# Patient Record
Sex: Male | Born: 1945 | Race: White | Hispanic: No | Marital: Married | Smoking: Never smoker
Health system: Southern US, Community
[De-identification: ages and names within clinical notes are randomized; demographics above are authoritative.]

## PROBLEM LIST (undated history)

## (undated) DIAGNOSIS — M1990 Unspecified osteoarthritis, unspecified site: Secondary | ICD-10-CM

## (undated) HISTORY — PX: OTHER SURGICAL HISTORY: SHX169

## (undated) HISTORY — PX: EYE SURGERY: SHX253

## (undated) HISTORY — PX: COLONOSCOPY: SHX174

---

## 2019-03-08 ENCOUNTER — Ambulatory Visit: Payer: Medicare Other | Admitting: Family Medicine

## 2019-03-08 ENCOUNTER — Encounter: Payer: Self-pay | Admitting: Family Medicine

## 2019-03-08 ENCOUNTER — Other Ambulatory Visit: Payer: Self-pay

## 2019-03-08 VITALS — BP 120/64 | HR 63 | Temp 98.4°F | Ht 68.0 in | Wt 175.5 lb

## 2019-03-08 DIAGNOSIS — M7502 Adhesive capsulitis of left shoulder: Secondary | ICD-10-CM | POA: Diagnosis not present

## 2019-03-08 DIAGNOSIS — M25512 Pain in left shoulder: Secondary | ICD-10-CM | POA: Diagnosis not present

## 2019-03-08 MED ORDER — METHYLPREDNISOLONE ACETATE 40 MG/ML IJ SUSP
40.0000 mg | Freq: Once | INTRAMUSCULAR | Status: AC
  Administered 2019-03-08: 12:00:00 40 mg via INTRAMUSCULAR

## 2019-03-08 NOTE — Progress Notes (Signed)
Cory Peloso T. Bryar Rennie, MD Primary Care and [place] at [facility] [place], [address] Phone: [phone]  FAX: [phone]  Cory Collins - 73 y.o. male  MRN [id number]  Date of Birth: 1946-06-04  Visit Date: 03/08/2019  PCP: Carley Hammed, MD  Referred by: No ref. provider found  Chief Complaint  Patient presents with  . Shoulder Pain    Left-hurt 6 months ago lifting weights   Subjective:   Cory Collins is a 73 y.o. very pleasant male patient who presents with the following: shoulder pain  The patient noted above presents with shoulder pain that has been ongoing for 6 mo. there is ? History of injury at gym and also using a pole saw The patient denies neck pain or radicular symptoms. No shoulder blade pain Denies dislocation, subluxation, separation of the shoulder. The patient does complain of pain with flexion, abduction, and terminal motion.  Significant restriction of motion. he describes a deep ache around the shoulder, and sometimes it will wake the patient up at night.  No old injrues  L adh caps  Medications Tried: tylenol and nsaids Ice or Heat: minimal help Tried PT: No  Prior shoulder Injury: No Prior surgery: No Prior fracture: No  Past Medical History, Surgical History, Social History, Family History, Medications, and allergies reviewed and updated if relevant.   There are no active problems to display for this patient.   History reviewed. No pertinent past medical history.  History reviewed. No pertinent surgical history.  Social History   Socioeconomic History  . Marital status: Married    Spouse name: Not on file  . Number of children: Not on file  . Years of education: Not on file  . Highest education level: Not on file  Occupational History  . Not on file  Social Needs  . Financial resource strain: Not on file  . Food insecurity    Worry: Not on file    Inability: Not on  file  . Transportation needs    Medical: Not on file    Non-medical: Not on file  Tobacco Use  . Smoking status: Never Smoker  . Smokeless tobacco: Never Used  Substance and Sexual Activity  . Alcohol use: Not on file  . Drug use: Not on file  . Sexual activity: Not on file  Lifestyle  . Physical activity    Days per week: Not on file    Minutes per session: Not on file  . Stress: Not on file  Relationships  . Social Herbalist on phone: Not on file    Gets together: Not on file    Attends religious service: Not on file    Active member of club or organization: Not on file    Attends meetings of clubs or organizations: Not on file    Relationship status: Not on file  . Intimate partner violence    Fear of current or ex partner: Not on file    Emotionally abused: Not on file    Physically abused: Not on file    Forced sexual activity: Not on file  Other Topics Concern  . Not on file  Social History Narrative  . Not on file    Family History  Problem Relation Age of Onset  . Cancer Sister     Allergies  Allergen Reactions  . Codeine Nausea And Vomiting    Med[facility] and [facility].  GEN: No fevers, chills. Nontoxic. Primarily MSK c/o today. MSK: Detailed in the HPI GI: tolerating PO intake without difficulty Neuro: No numbness, parasthesias, or tingling associated. Otherwise the pertinent positives of the ROS are noted above.    Objective:   Blood pressure 120/64, pulse 63, temperature 98.4 F (36.9 C), temperature source Temporal, height 5\' 8"  (1.727 m), weight 175 lb 8 oz (79.6 kg), SpO2 97 %.   GEN: WDWN, NAD, Non-toxic, Alert & Oriented x 3 HEENT: Atraumatic, Normocephalic.  Ears and Nose: No external deformity. EXTR: No clubbing/cyanosis/edema NEURO: Normal gait.  PSYCH: Normally interactive. Conversant. Not depressed or anxious appearing.  Calm demeanor.   Shoulder: R and L Inspection: No muscle  wasting or winging Ecchymosis/edema: neg  AC joint, scapula, clavicle: NT Cervical spine: NT, full ROM Spurling's: neg ABNORMAL SIDE TESTED: L UNLESS OTHERWISE NOTED, THE CONTRALATERAL SIDE HAS FULL RANGE OF MOTION. Abduction: 5/5, LIMITED TO 140 DEGREES Flexion: 5/5, LIMITED TO 130 DEGNO ROM  IR, lift-off: 5/5. TESTED AT 90 DEGREES OF ABDUCTION, LIMITED TO 0 DEGREES ER at neutral:  5/5, TESTED AT 90 DEGREES OF ABDUCTION, LIMITED TO 25 DEGREES AC crossover and compression: PAIN Drop Test: neg Empty Can: neg Supraspinatus insertion: NT Bicipital groove: NT ALL OTHER SPECIAL TESTING EQUIVOCAL GIVEN LOSS OF MOTION C5-T1 intact Sensation intact Grip 5/5   Assessment and Plan:     ICD-10-CM   1. Adhesive capsulitis of left shoulder  M75.02   2. Acute pain of left shoulder  M25.512 methylPREDNISolone acetate (DEPO-MEDROL) injection 40 mg    >25 minutes spent in face to face time with patient, >50% spent in counselling or coordination of care  Patient was given a systematic ROM protocol from [employer] to be done daily. Emphasized importance of adherence, daily HEP.  The average length of total symptoms is 12-18 months going through 3 different phases in the freezing and thawing process. Reviewed all with patient.   Tylenol or NSAID of choice prn for pain relief Intraarticular shoulder injections discussed with patient, which have good evidence for accelerating the thawing phase.  Patient will be sent for formal PT for aggressive frozen shoulder ROM when in thawing phase Will need RTC str and scapular stabilization to fix underlying mechanics.  Intraarticular Shoulder Aspiration/Injection Procedure Note Kari BaarsLanny Rettinger 05/26/1946 Date of procedure: 03/08/2019  Procedure: Large Joint Aspiration / Injection of Shoulder, Intraarticular, L Indications: Pain  Procedure Details Verbal consent was obtained from the patient. Risks including infection explained and contrasted with benefits and  alternatives. Patient prepped with Chloraprep and Ethyl Chloride used for anesthesia. An intraarticular shoulder injection was performed using the posterior approach; needle placed into joint capsule without difficulty. The patient tolerated the procedure well and had decreased pain post injection. No complications. Injection: 8 cc of Lidocaine 1% and 2 mL Depo-Medrol 40 mg. Needle: 21 gauge, 2 inch   Follow-up: Return in about 2 months (around 05/08/2019).  Meds ordered this encounter  Medications  . methylPREDNISolone acetate (DEPO-MEDROL) injection 40 mg   No orders of the defined types were placed in this encounter.   Signed,  Elpidio GaleaSpencer T. Monay Houlton, MD   Patient's Medications   No medications on file

## 2019-03-28 ENCOUNTER — Telehealth: Payer: Self-pay | Admitting: Family Medicine

## 2019-03-28 DIAGNOSIS — M7502 Adhesive capsulitis of left shoulder: Secondary | ICD-10-CM

## 2019-03-28 NOTE — Telephone Encounter (Signed)
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  Dr.Copland told patient to call him back, if his shoulder was doing better and Dr.Copland would refer patient to physical therapy.  Patient said his shoulder is doing better.  Patient wants to go to [place] in [place].  Patient can go anytime.

## 2019-03-29 NOTE — Telephone Encounter (Signed)
done

## 2019-05-07 NOTE — Progress Notes (Signed)
Cory Collins T. [name], MD Primary Care and Sports Medicine [facility] at [facility] [address] [place], [address] Phone: [phone]  FAX: [phone]  Cory Collins - 73 y.o. male  MRN [id number]  Date of Birth: [birth year]  Visit Date: 05/08/2019  PCP: Ventura Sellers, MD  Referred by: Ventura Sellers, MD  Chief Complaint  Patient presents with  . Follow-up    Left Shoulder   Subjective:   Cory Collins is a 73 y.o. very pleasant male patient with Body mass index is 26.84 kg/m. who presents with the following:  8 mo of significant frozen shoulder who is here for 6 week follow-up.  At last OV I did an intraarticular shoulder injection with steroids and gave him some HEP from [facility].  Stretching 3 times a day. He really has not made any significant progress at all, his shoulder still hurts quite a bit.  This is approximately 10 months in time duration.   03/28/2019 Last OV with Hannah Beat, MD  The patient noted above presents with shoulder pain that has been ongoing for 6 mo. there is ? History of injury at gym and also using a pole saw The patient denies neck pain or radicular symptoms. No shoulder blade pain Denies dislocation, subluxation, separation of the shoulder. The patient does complain of pain with flexion, abduction, and terminal motion.  Significant restriction of motion. he describes a deep ache around the shoulder, and sometimes it will wake the patient up at night.  No old injrues  L adh caps  Medications Tried: tylenol and nsaids Ice or Heat: minimal help Tried PT: No  Prior shoulder Injury: No Prior surgery: No Prior fracture: No   Past Medical History, Surgical History, Social History, Family History, Problem List, Medications, and Allergies have been reviewed and updated if relevant.  There are no active problems to display for this patient.   History reviewed. No pertinent past medical history.  History  reviewed. No pertinent surgical history.  Social History   Socioeconomic History  . Marital status: Married    Spouse name: Not on file  . Number of children: Not on file  . Years of education: Not on file  . Highest education level: Not on file  Occupational History  . Not on file  Social Needs  . Financial resource strain: Not on file  . Food insecurity    Worry: Not on file    Inability: Not on file  . Transportation needs    Medical: Not on file    Non-medical: Not on file  Tobacco Use  . Smoking status: Never Smoker  . Smokeless tobacco: Never Used  Substance and Sexual Activity  . Alcohol use: Not on file  . Drug use: Not on file  . Sexual activity: Not on file  Lifestyle  . Physical activity    Days per week: Not on file    Minutes per session: Not on file  . Stress: Not on file  Relationships  . Social Musician on phone: Not on file    Gets together: Not on file    Attends religious service: Not on file    Active member of club or organization: Not on file    Attends meetings of clubs or organizations: Not on file    Relationship status: Not on file  . Intimate partner violence    Fear of current or ex partner: Not on file    Emotionally abused:  Not on file    Physically abused: Not on file    Forced sexual activity: Not on file  Other Topics Concern  . Not on file  Social History Narrative  . Not on file    Family History  Problem Relation Age of Onset  . Cancer Sister     Allergies  Allergen Reactions  . Codeine Nausea And Vomiting    Medication list reviewed and updated in full in [facility].  GEN: No fevers, chills. Nontoxic. Primarily MSK c/o today. MSK: Detailed in the HPI GI: tolerating PO intake without difficulty Neuro: No numbness, parasthesias, or tingling associated. Otherwise the pertinent positives of the ROS are noted above.   Objective:   BP 140/80   Pulse 64   Temp 98.2 F (36.8 C) (Temporal)   Ht 5'  8" (1.727 m)   Wt 176 lb 8 oz (80.1 kg)   SpO2 97%   BMI 26.84 kg/m    GEN: WDWN, NAD, Non-toxic, Alert & Oriented x 3 HEENT: Atraumatic, Normocephalic.  Ears and Nose: No external deformity. EXTR: No clubbing/cyanosis/edema NEURO: Normal gait.  PSYCH: Normally interactive. Conversant. Not depressed or anxious appearing.  Calm demeanor.   Shoulder: R and L Inspection: No muscle wasting or winging Ecchymosis/edema: neg  AC joint, scapula, clavicle: NT Cervical spine: NT, full ROM Spurling's: neg ABNORMAL SIDE TESTED: L UNLESS OTHERWISE NOTED, THE CONTRALATERAL SIDE HAS FULL RANGE OF MOTION. Abduction: 4/5, LIMITED TO 125 DEGREES Flexion: 5/5, LIMITED TO 125 DEGNO ROM  IR, lift-off: 5/5. TESTED AT 90 DEGREES OF ABDUCTION, LIMITED TO 0 DEGREES ER at neutral:  5/5, TESTED AT 90 DEGREES OF ABDUCTION, LIMITED TO 35 DEGREES AC crossover and compression: PAIN Drop Test: neg Empty Can: neg Supraspinatus insertion: NT Bicipital groove: NT ALL OTHER SPECIAL TESTING EQUIVOCAL GIVEN LOSS OF MOTION C5-T1 intact Sensation intact Grip 5/5    Radiology: No results found.   Assessment and Plan:     ICD-10-CM   1. Adhesive capsulitis of left shoulder  M75.02 DG Shoulder Left    MR Shoulder Left Wo Contrast  2. Acute pain of left shoulder  M25.512 DG Shoulder Left    MR Shoulder Left Wo Contrast   >25 minutes spent in face to face time with patient, >50% spent in counselling or coordination of care   Failure to improve on 10 months of conservative care.  He has been to formal physical therapy, extensive home rehab program, and I did an intra-articular shoulder injection on his last visit 2 months ago.  He continues to do poorly and have pain with abduction, internal range of motion, as well as external range of motion.  Obtain an MRI of the left shoulder to further evaluate to determine if the patient has a partial-thickness or full-thickness rotator cuff tear.  At this point he is  failed conservative management.  We talked fairly extensively about conservative versus more aggressive management of his shoulder condition, the patient chose to obtain an MRI to scout for potential surgery.  Follow-up: No follow-ups on file.  No orders of the defined types were placed in this encounter.  Orders Placed This Encounter  Procedures  . DG Shoulder Left  . MR Shoulder Left Wo Contrast    Signed,  Journey Ratterman T. Ishia Tenorio, MD   No outpatient encounter medications on file as of 05/08/2019.   No facility-administered encounter medications on file as of 05/08/2019.

## 2019-05-08 ENCOUNTER — Ambulatory Visit (INDEPENDENT_AMBULATORY_CARE_PROVIDER_SITE_OTHER): Payer: Medicare Other | Admitting: Family Medicine

## 2019-05-08 ENCOUNTER — Ambulatory Visit (INDEPENDENT_AMBULATORY_CARE_PROVIDER_SITE_OTHER)
Admission: RE | Admit: 2019-05-08 | Discharge: 2019-05-08 | Disposition: A | Discharge status: Home / Self Care | Payer: Medicare Other | Source: Ambulatory Visit | Attending: Family Medicine | Admitting: Family Medicine

## 2019-05-08 ENCOUNTER — Other Ambulatory Visit: Payer: Self-pay

## 2019-05-08 ENCOUNTER — Encounter: Payer: Self-pay | Admitting: Family Medicine

## 2019-05-08 VITALS — BP 140/80 | HR 64 | Temp 98.2°F | Ht 68.0 in | Wt 176.5 lb

## 2019-05-08 DIAGNOSIS — M25512 Pain in left shoulder: Secondary | ICD-10-CM

## 2019-05-08 DIAGNOSIS — M7502 Adhesive capsulitis of left shoulder: Secondary | ICD-10-CM

## 2019-05-17 ENCOUNTER — Ambulatory Visit
Admission: RE | Admit: 2019-05-17 | Discharge: 2019-05-17 | Disposition: A | Discharge status: Home / Self Care | Payer: Medicare Other | Source: Ambulatory Visit | Attending: Family Medicine | Admitting: Family Medicine

## 2019-05-17 ENCOUNTER — Other Ambulatory Visit: Payer: Self-pay

## 2019-05-17 DIAGNOSIS — M7502 Adhesive capsulitis of left shoulder: Secondary | ICD-10-CM | POA: Insufficient documentation

## 2019-05-17 DIAGNOSIS — M25512 Pain in left shoulder: Secondary | ICD-10-CM | POA: Diagnosis not present

## 2019-05-20 ENCOUNTER — Ambulatory Visit: Payer: Medicare Other

## 2019-07-19 ENCOUNTER — Ambulatory Visit: Payer: Medicare Other | Admitting: Family Medicine

## 2019-11-23 ENCOUNTER — Other Ambulatory Visit: Payer: Self-pay | Admitting: Orthopedic Surgery

## 2019-11-23 DIAGNOSIS — M25512 Pain in left shoulder: Secondary | ICD-10-CM

## 2019-11-29 ENCOUNTER — Telehealth: Payer: Self-pay | Admitting: Family Medicine

## 2019-11-29 ENCOUNTER — Other Ambulatory Visit: Payer: Self-pay

## 2019-11-29 ENCOUNTER — Ambulatory Visit
Admission: RE | Admit: 2019-11-29 | Discharge: 2019-11-29 | Disposition: A | Discharge status: Home / Self Care | Payer: Medicare PPO | Source: Ambulatory Visit | Attending: Orthopedic Surgery | Admitting: Orthopedic Surgery

## 2019-11-29 DIAGNOSIS — M25512 Pain in left shoulder: Secondary | ICD-10-CM

## 2019-11-29 NOTE — Telephone Encounter (Signed)
Surgical Clearance will need to be done by his PCP.

## 2019-11-29 NOTE — Telephone Encounter (Signed)
Please document on form and fax back to [facility].  Form on Donna's desk.

## 2019-11-29 NOTE — Telephone Encounter (Signed)
Surgical Clearance form faxed back to [facility] advising them that Cory Collins surgical clearance needs to be done by his PCP: Dr. Ventura Sellers.

## 2019-11-29 NOTE — Telephone Encounter (Signed)
Clearance for surgery form faxed from [facility]. Placed on cart.

## 2019-12-14 ENCOUNTER — Other Ambulatory Visit: Payer: Medicare Other

## 2020-01-03 NOTE — Progress Notes (Signed)
PCP - Dr. Ventura Sellers Cardiologist -   PPM/ICD -  Device Orders -  Rep Notified -   Chest x-ray -  EKG -  Stress Test -  ECHO -  Cardiac Cath -   Sleep Study -  CPAP -   Fasting Blood Sugar -  Checks Blood Sugar _____ times a day  Blood Thinner Instructions: Aspirin Instructions:  ERAS Protcol - PRE-SURGERY Ensure or G2-   COVID TEST-    Anesthesia review:   Patient denies shortness of breath, fever, cough and chest pain at PAT appointment  none   All instructions explained to the patient, with a verbal understanding of the material. Patient agrees to go over the instructions while at home for a better understanding. Patient also instructed to self quarantine after being tested for COVID-19. The opportunity to ask questions was provided.

## 2020-01-03 NOTE — Patient Instructions (Addendum)
DUE TO COVID-19 ONLY ONE VISITOR IS ALLOWED TO COME WITH YOU AND STAY IN THE WAITING ROOM ONLY DURING PRE OP AND PROCEDURE DAY OF SURGERY. Two  VISITOR MAY VISIT WITH YOU AFTER SURGERY IN YOUR PRIVATE ROOM DURING VISITING HOURS ONLY! 10a-8pm  YOU NEED TO HAVE A COVID 19 TEST ON____6-15-21___ @_10 :30______, THIS TEST MUST BE DONE BEFORE SURGERY, COME  [address], [place] , [address].  ([place]) ONCE YOUR COVID TEST IS COMPLETED, PLEASE BEGIN THE QUARANTINE INSTRUCTIONS AS OUTLINED IN YOUR HANDOUT.                Cory Collins  01/03/2020   Your procedure is scheduled on: 6-18 -21   Report to [facility]  Entrance   Report to admitting at       12:30 PM     Call this number if you have problems the morning of surgery  [phone]    Remember: NO SOLID FOOD AFTER MIDNIGHT THE NIGHT PRIOR TO SURGERY. NOTHING BY MOUTH EXCEPT CLEAR LIQUIDS UNTIL 12:00pm . PLEASE FINISH ENSURE DRINK PER SURGEON ORDER  WHICH NEEDS TO BE COMPLETED AT     12:00 pm then nothing by mouth .    CLEAR LIQUID DIET   Foods Allowed                                                                         Foods Excluded  Coffee and tea, regular and decaf  No creamer                           liquids that you cannot  Plain Jell-O any favor except red or purple                                           see through such as: Fruit ices (not with fruit pulp)                                                         milk, soups, orange juice  Iced Popsicles                                                       All solid food Carbonated beverages, regular and diet                                    Cranberry, grape and apple juices Sports drinks like Gatorade Lightly seasoned clear broth or consume(fat free) Sugar, honey syrup     BRUSH YOUR TEETH MORNING OF SURGERY AND RINSE YOUR MOUTH OUT, NO CHEWING GUM CANDY OR MINTS.     Take  these medicines the morning of surgery with A SIP OF  WATER: NONE               You may not have any metal on your body including hair pins and              piercings  Do not wear jewelry,  lotions, powders or perfumes, deodorant                        Men may shave face and neck.   Do not bring valuables to the hospital. [facility] IS NOT             RESPONSIBLE   FOR VALUABLES.  Contacts, dentures or bridgework may not be worn into surgery.      Patients discharged the day of surgery will not be allowed to drive home. IF YOU ARE HAVING SURGERY AND GOING HOME THE SAME DAY, YOU MUST HAVE AN ADULT TO DRIVE YOU HOME AND BE WITH YOU FOR 24 HOURS. YOU MAY GO HOME BY TAXI OR UBER OR ORTHERWISE, BUT AN ADULT MUST ACCOMPANY YOU HOME AND STAY WITH YOU FOR 24 HOURS.  Name and phone number of your driver:  Special Instructions: N/A              Please read over the following fact sheets you were given: _____________________________________________________________________ [facility]- Preparing for Total Shoulder Arthroplasty    Before surgery, you can play an important role. Because skin is not sterile, your skin needs to be as free of germs as possible. You can reduce the number of germs on your skin by using the following products. . Benzoyl Peroxide Gel o Reduces the number of germs present on the skin o Applied twice a day to shoulder area starting two days before surgery    ==================================================================  Please follow these instructions carefully:  BENZOYL PEROXIDE 5% GEL  Please do not use if you have an allergy to benzoyl peroxide.   If your skin becomes reddened/irritated stop using the benzoyl peroxide.  Starting two days before surgery, apply as follows: 1. Apply benzoyl peroxide in the morning and at night. Apply after taking a shower. If you are not taking a shower clean entire shoulder front, back, and side along with the armpit with a clean wet washcloth.  2. Place a quarter-sized dollop on  your shoulder and rub in thoroughly, making sure to cover the front, back, and side of your shoulder, along with the armpit.   2 days before ____ AM   ____ PM              1 day before ____ AM   ____ PM                         3. Do this twice a day for two days.  (Last application is the night before surgery, AFTER using the CHG soap as described below).  4. Do NOT apply benzoyl peroxide gel on the day of surgery.             [facility] - Preparing for Surgery Before surgery, you can play an important role.  Because skin is not sterile, your skin needs to be as free of germs as possible.  You can reduce the number of germs on your skin by washing with CHG (chlorahexidine gluconate) soap before surgery.  CHG is an antiseptic cleaner which kills germs  and bonds with the skin to continue killing germs even after washing. Please DO NOT use if you have an allergy to CHG or antibacterial soaps.  If your skin becomes reddened/irritated stop using the CHG and inform your nurse when you arrive at Short Stay. Do not shave (including legs and underarms) for at least 48 hours prior to the first CHG shower.  You may shave your face/neck. Please follow these instructions carefully:  1.  Shower with CHG Soap the night before surgery and the  morning of Surgery.  2.  If you choose to wash your hair, wash your hair first as usual with your  normal  shampoo.  3.  After you shampoo, rinse your hair and body thoroughly to remove the  shampoo.                           4.  Use CHG as you would any other liquid soap.  You can apply chg directly  to the skin and wash                       Gently with a scrungie or clean washcloth.  5.  Apply the CHG Soap to your body ONLY FROM THE NECK DOWN.   Do not use on face/ open                           Wound or open sores. Avoid contact with eyes, ears mouth and genitals (private parts).                       Wash face,  Genitals (private parts) with your normal soap.              6.  Wash thoroughly, paying special attention to the area where your surgery  will be performed.  7.  Thoroughly rinse your body with warm water from the neck down.  8.  DO NOT shower/wash with your normal soap after using and rinsing off  the CHG Soap.                9.  Pat yourself dry with a clean towel.            10.  Wear clean pajamas.            11.  Place clean sheets on your bed the night of your first shower and do not  sleep with pets. Day of Surgery : Do not apply any lotions/deodorants the morning of surgery.  Please wear clean clothes to the hospital/surgery center.  FAILURE TO FOLLOW THESE INSTRUCTIONS MAY RESULT IN THE CANCELLATION OF YOUR SURGERY PATIENT SIGNATURE_________________________________  NURSE SIGNATURE__________________________________  ________________________________________________________________________   Cory Collins  An incentive spirometer is a tool that can help keep your lungs clear and active. This tool measures how well you are filling your lungs with each breath. Taking long deep breaths may help reverse or decrease the chance of developing breathing (pulmonary) problems (especially infection) following:  A long period of time when you are unable to move or be active. BEFORE THE PROCEDURE   If the spirometer includes an indicator to show your best effort, your nurse or respiratory therapist will set it to a desired goal.  If possible, sit up straight or lean slightly forward. Try not to slouch.  Hold the incentive spirometer in an upright position.  INSTRUCTIONS FOR USE  1. Sit on the edge of your bed if possible, or sit up as far as you can in bed or on a chair. 2. Hold the incentive spirometer in an upright position. 3. Breathe out normally. 4. Place the mouthpiece in your mouth and seal your lips tightly around it. 5. Breathe in slowly and as deeply as possible, raising the piston or the ball toward the top of the  column. 6. Hold your breath for 3-5 seconds or for as long as possible. Allow the piston or ball to fall to the bottom of the column. 7. Remove the mouthpiece from your mouth and breathe out normally. 8. Rest for a few seconds and repeat Steps 1 through 7 at least 10 times every 1-2 hours when you are awake. Take your time and take a few normal breaths between deep breaths. 9. The spirometer may include an indicator to show your best effort. Use the indicator as a goal to work toward during each repetition. 10. After each set of 10 deep breaths, practice coughing to be sure your lungs are clear. If you have an incision (the cut made at the time of surgery), support your incision when coughing by placing a pillow or rolled up towels firmly against it. Once you are able to get out of bed, walk around indoors and cough well. You may stop using the incentive spirometer when instructed by your caregiver.  RISKS AND COMPLICATIONS  Take your time so you do not get dizzy or light-headed.  If you are in pain, you may need to take or ask for pain medication before doing incentive spirometry. It is harder to take a deep breath if you are having pain. AFTER USE  Rest and breathe slowly and easily.  It can be helpful to keep track of a log of your progress. Your caregiver can provide you with a simple table to help with this. If you are using the spirometer at home, follow these instructions: Cory Collins IF:   You are having difficultly using the spirometer.  You have trouble using the spirometer as often as instructed.  Your pain medication is not giving enough relief while using the spirometer.  You develop fever of 100.5 F (38.1 C) or higher. SEEK IMMEDIATE MEDICAL CARE IF:   You cough up bloody sputum that had not been present before.  You develop fever of 102 F (38.9 C) or greater.  You develop worsening pain at or near the incision site. MAKE SURE YOU:   Understand these  instructions.  Will watch your condition.  Will get help right away if you are not doing well or get worse. Document Released: 11/23/2006 Document Revised: 10/05/2011 Document Reviewed: 01/24/2007 [facility] Patient Information 2014 [place], [place].   ________________________________________________________________________

## 2020-01-04 ENCOUNTER — Encounter (HOSPITAL_COMMUNITY)
Admission: RE | Admit: 2020-01-04 | Discharge: 2020-01-04 | Disposition: A | Discharge status: Home / Self Care | Payer: Medicare PPO | Source: Ambulatory Visit | Attending: Orthopedic Surgery | Admitting: Orthopedic Surgery

## 2020-01-04 ENCOUNTER — Encounter (HOSPITAL_COMMUNITY): Payer: Self-pay

## 2020-01-04 ENCOUNTER — Other Ambulatory Visit: Payer: Self-pay

## 2020-01-04 DIAGNOSIS — Z01812 Encounter for preprocedural laboratory examination: Secondary | ICD-10-CM | POA: Insufficient documentation

## 2020-01-04 HISTORY — DX: Unspecified osteoarthritis, unspecified site: M19.90

## 2020-01-04 LAB — CBC
HCT: 46.4 % (ref 39.0–52.0)
Hemoglobin: 15.6 g/dL (ref 13.0–17.0)
MCH: 33.3 pg (ref 26.0–34.0)
MCHC: 33.6 g/dL (ref 30.0–36.0)
MCV: 99.1 fL (ref 80.0–100.0)
Platelets: 199 10*3/uL (ref 150–400)
RBC: 4.68 MIL/uL (ref 4.22–5.81)
RDW: 12.1 % (ref 11.5–15.5)
WBC: 5.5 10*3/uL (ref 4.0–10.5)
nRBC: 0 % (ref 0.0–0.2)

## 2020-01-04 LAB — SURGICAL PCR SCREEN
MRSA, PCR: NEGATIVE
Staphylococcus aureus: NEGATIVE

## 2020-01-09 ENCOUNTER — Other Ambulatory Visit (HOSPITAL_COMMUNITY)
Admission: RE | Admit: 2020-01-09 | Discharge: 2020-01-09 | Disposition: A | Discharge status: Home / Self Care | Payer: Medicare PPO | Source: Ambulatory Visit | Attending: Orthopedic Surgery | Admitting: Orthopedic Surgery

## 2020-01-09 DIAGNOSIS — Z01812 Encounter for preprocedural laboratory examination: Secondary | ICD-10-CM | POA: Insufficient documentation

## 2020-01-09 DIAGNOSIS — Z20822 Contact with and (suspected) exposure to covid-19: Secondary | ICD-10-CM | POA: Diagnosis not present

## 2020-01-09 LAB — SARS CORONAVIRUS 2 (TAT 6-24 HRS): SARS Coronavirus 2: NEGATIVE

## 2020-01-09 NOTE — H&P (Signed)
  Patient's anticipated LOS is less than 2 midnights, meeting these requirements: - Younger than 12 - Lives within 1 hour of care - Has a competent adult at home to recover with post-op recover - NO history of  - Chronic pain requiring opiods  - Diabetes  - Coronary Artery Disease  - Heart failure  - Heart attack  - Stroke  - DVT/VTE  - Cardiac arrhythmia  - Respiratory Failure/COPD  - Renal failure  - Anemia  - Advanced Liver disease       Cory Collins is an 74 y.o. male.    Chief Complaint: left shoulder pain  HPI: Pt is a 74 y.o. male complaining of left shoulder pain for multiple years. Pain had continually increased since the beginning. X-rays in the clinic show end-stage arthritic changes of the left shoulder. Pt has tried various conservative treatments which have failed to alleviate their symptoms, including injections and therapy. Various options are discussed with the patient. Risks, benefits and expectations were discussed with the patient. Patient understand the risks, benefits and expectations and wishes to proceed with surgery.   PCP:  Ventura Sellers, MD  D/C Plans: Home  PMH: Past Medical History:  Diagnosis Date  . Arthritis     PSH: Past Surgical History:  Procedure Laterality Date  . COLONOSCOPY    . cyst removed     second toe right foot  . EYE SURGERY     lasik eye surgery    Social History:  reports that he has never smoked. He has never used smokeless tobacco. He reports previous alcohol use. He reports that he does not use drugs.  Allergies:  Allergies  Allergen Reactions  . Codeine Nausea And Vomiting    Medications: No current facility-administered medications for this encounter.   No current outpatient medications on file.    No results found for this or any previous visit (from the past 48 hour(s)). No results found.  ROS: Pain with rom of the left upper extremity  Physical Exam: Alert and oriented 74 y.o. male in no  acute distress Cranial nerves 2-12 intact Cervical spine: full rom with no tenderness, nv intact distally Chest: active breath sounds bilaterally, no wheeze rhonchi or rales Heart: regular rate and rhythm, no murmur Abd: non tender non distended with active bowel sounds Hip is stable with rom  Left shoulder painful rom  Crepitus with rom nv intact distally No rashes or edema distally  Assessment/Plan Assessment: left shoulder end stage osteoarthritis  Plan:  Patient will undergo a left total shoulder  by Dr. Ranell Patrick at [facility] Risks benefits and expectations were discussed with the patient. Patient understand risks, benefits and expectations and wishes to proceed. Preoperative templating of the joint replacement has been completed, documented, and submitted to the Operating Room personnel in order to optimize intra-operative equipment management.   Alphonsa Overall PA-C, MPAS [facility] is now [employer] [address], [place], [place] [address] Phone: [phone] [email or web address] Facebook  [employer]

## 2020-01-12 ENCOUNTER — Other Ambulatory Visit: Payer: Self-pay

## 2020-01-12 ENCOUNTER — Encounter (HOSPITAL_COMMUNITY)
Admission: RE | Disposition: A | Discharge status: Home / Self Care | Payer: Self-pay | Source: Ambulatory Visit | Attending: Orthopedic Surgery

## 2020-01-12 ENCOUNTER — Observation Stay (HOSPITAL_COMMUNITY): Payer: Medicare PPO

## 2020-01-12 ENCOUNTER — Encounter (HOSPITAL_COMMUNITY): Payer: Self-pay | Admitting: Orthopedic Surgery

## 2020-01-12 ENCOUNTER — Observation Stay (HOSPITAL_COMMUNITY)
Admission: RE | Admit: 2020-01-12 | Discharge: 2020-01-13 | Disposition: A | Discharge status: Home / Self Care | Payer: Medicare PPO | Source: Ambulatory Visit | Attending: Orthopedic Surgery | Admitting: Orthopedic Surgery

## 2020-01-12 ENCOUNTER — Ambulatory Visit (HOSPITAL_COMMUNITY): Payer: Medicare PPO | Admitting: Certified Registered Nurse Anesthetist

## 2020-01-12 DIAGNOSIS — M25712 Osteophyte, left shoulder: Secondary | ICD-10-CM | POA: Diagnosis not present

## 2020-01-12 DIAGNOSIS — Z885 Allergy status to narcotic agent status: Secondary | ICD-10-CM | POA: Insufficient documentation

## 2020-01-12 DIAGNOSIS — M19012 Primary osteoarthritis, left shoulder: Principal | ICD-10-CM | POA: Insufficient documentation

## 2020-01-12 DIAGNOSIS — Z96612 Presence of left artificial shoulder joint: Secondary | ICD-10-CM

## 2020-01-12 HISTORY — PX: TOTAL SHOULDER ARTHROPLASTY: SHX126

## 2020-01-12 SURGERY — ARTHROPLASTY, SHOULDER, TOTAL
Anesthesia: General | Site: Shoulder | Laterality: Left

## 2020-01-12 MED ORDER — TRANEXAMIC ACID-NACL 1000-0.7 MG/100ML-% IV SOLN
1000.0000 mg | INTRAVENOUS | Status: AC
  Administered 2020-01-12: 1000 mg via INTRAVENOUS

## 2020-01-12 MED ORDER — ONDANSETRON HCL 4 MG PO TABS: 4.0000 mg | ORAL_TABLET | Freq: Four times a day (QID) | ORAL | Status: DC | PRN

## 2020-01-12 MED ORDER — SUGAMMADEX SODIUM 200 MG/2ML IV SOLN
INTRAVENOUS | Status: DC | PRN
  Administered 2020-01-12: 200 mg via INTRAVENOUS
  Administered 2020-01-12: 50 mg via INTRAVENOUS

## 2020-01-12 MED ORDER — ROCURONIUM BROMIDE 10 MG/ML (PF) SYRINGE
PREFILLED_SYRINGE | INTRAVENOUS | Status: DC | PRN
  Administered 2020-01-12: 80 mg via INTRAVENOUS

## 2020-01-12 MED ORDER — BUPIVACAINE-EPINEPHRINE (PF) 0.5% -1:200000 IJ SOLN
INTRAMUSCULAR | Status: AC
  Filled 2020-01-12: qty 30

## 2020-01-12 MED ORDER — BISACODYL 10 MG RE SUPP: 10.0000 mg | Freq: Every day | RECTAL | Status: DC | PRN

## 2020-01-12 MED ORDER — ACETAMINOPHEN 325 MG PO TABS: 325.0000 mg | ORAL_TABLET | Freq: Four times a day (QID) | ORAL | Status: DC | PRN

## 2020-01-12 MED ORDER — PHENYLEPHRINE HCL-NACL 20-0.9 MG/250ML-% IV SOLN
INTRAVENOUS | Status: DC | PRN
  Administered 2020-01-12: 30 ug/min via INTRAVENOUS

## 2020-01-12 MED ORDER — FENTANYL CITRATE (PF) 100 MCG/2ML IJ SOLN
INTRAMUSCULAR | Status: DC | PRN
  Administered 2020-01-12 (×3): 50 ug via INTRAVENOUS

## 2020-01-12 MED ORDER — METHOCARBAMOL 500 MG IVPB - SIMPLE MED
500.0000 mg | Freq: Four times a day (QID) | INTRAVENOUS | Status: DC | PRN
  Administered 2020-01-12: 500 mg via INTRAVENOUS
  Filled 2020-01-12: qty 50

## 2020-01-12 MED ORDER — METOCLOPRAMIDE HCL 5 MG/ML IJ SOLN
5.0000 mg | Freq: Three times a day (TID) | INTRAMUSCULAR | Status: DC | PRN
  Administered 2020-01-12: 10 mg via INTRAVENOUS
  Filled 2020-01-12: qty 2

## 2020-01-12 MED ORDER — FENTANYL CITRATE (PF) 250 MCG/5ML IJ SOLN
INTRAMUSCULAR | Status: AC
  Filled 2020-01-12: qty 5

## 2020-01-12 MED ORDER — DOCUSATE SODIUM 100 MG PO CAPS
100.0000 mg | ORAL_CAPSULE | Freq: Two times a day (BID) | ORAL | Status: DC
  Administered 2020-01-12 – 2020-01-13 (×2): 100 mg via ORAL
  Filled 2020-01-12 (×2): qty 1

## 2020-01-12 MED ORDER — POLYETHYLENE GLYCOL 3350 17 G PO PACK: 17.0000 g | PACK | Freq: Every day | ORAL | Status: DC | PRN

## 2020-01-12 MED ORDER — METOCLOPRAMIDE HCL 5 MG PO TABS: 5.0000 mg | ORAL_TABLET | Freq: Three times a day (TID) | ORAL | Status: DC | PRN

## 2020-01-12 MED ORDER — OXYCODONE HCL 5 MG PO TABS
5.0000 mg | ORAL_TABLET | ORAL | Status: DC | PRN
  Filled 2020-01-12: qty 2

## 2020-01-12 MED ORDER — MIDAZOLAM HCL 2 MG/2ML IJ SOLN
1.0000 mg | INTRAMUSCULAR | Status: DC
  Administered 2020-01-12: 2 mg via INTRAVENOUS
  Filled 2020-01-12: qty 2

## 2020-01-12 MED ORDER — HYDROMORPHONE HCL 1 MG/ML IJ SOLN: 0.2500 mg | INTRAMUSCULAR | Status: DC | PRN

## 2020-01-12 MED ORDER — ONDANSETRON HCL 4 MG/2ML IJ SOLN
4.0000 mg | Freq: Four times a day (QID) | INTRAMUSCULAR | Status: DC | PRN
  Administered 2020-01-12 – 2020-01-13 (×2): 4 mg via INTRAVENOUS
  Filled 2020-01-12 (×2): qty 2

## 2020-01-12 MED ORDER — ORAL CARE MOUTH RINSE: 15.0000 mL | Freq: Once | OROMUCOSAL | Status: DC

## 2020-01-12 MED ORDER — CHLORHEXIDINE GLUCONATE 0.12 % MT SOLN: 15.0000 mL | Freq: Once | OROMUCOSAL | Status: DC

## 2020-01-12 MED ORDER — LACTATED RINGERS IV SOLN: INTRAVENOUS | Status: DC

## 2020-01-12 MED ORDER — TRANEXAMIC ACID 1000 MG/10ML IV SOLN
2000.0000 mg | Freq: Once | INTRAVENOUS | Status: DC
  Filled 2020-01-12: qty 20

## 2020-01-12 MED ORDER — MENTHOL 3 MG MT LOZG: 1.0000 | LOZENGE | OROMUCOSAL | Status: DC | PRN

## 2020-01-12 MED ORDER — STERILE WATER FOR IRRIGATION IR SOLN
Status: DC | PRN
  Administered 2020-01-12: 2000 mL

## 2020-01-12 MED ORDER — FENTANYL CITRATE (PF) 100 MCG/2ML IJ SOLN
50.0000 ug | INTRAMUSCULAR | Status: DC
  Administered 2020-01-12: 100 ug via INTRAVENOUS
  Filled 2020-01-12: qty 2

## 2020-01-12 MED ORDER — METHOCARBAMOL 500 MG PO TABS: 500.0000 mg | ORAL_TABLET | Freq: Four times a day (QID) | ORAL | 1 refills | Status: AC | PRN

## 2020-01-12 MED ORDER — PHENOL 1.4 % MT LIQD: 1.0000 | OROMUCOSAL | Status: DC | PRN

## 2020-01-12 MED ORDER — SODIUM CHLORIDE 0.9 % IV SOLN: INTRAVENOUS | Status: DC

## 2020-01-12 MED ORDER — PHENYLEPHRINE 40 MCG/ML (10ML) SYRINGE FOR IV PUSH (FOR BLOOD PRESSURE SUPPORT)
PREFILLED_SYRINGE | INTRAVENOUS | Status: AC
  Filled 2020-01-12: qty 10

## 2020-01-12 MED ORDER — 0.9 % SODIUM CHLORIDE (POUR BTL) OPTIME
TOPICAL | Status: DC | PRN
  Administered 2020-01-12: 1000 mL

## 2020-01-12 MED ORDER — BUPIVACAINE-EPINEPHRINE (PF) 0.25% -1:200000 IJ SOLN
INTRAMUSCULAR | Status: DC | PRN
  Administered 2020-01-12: 9 mL via PERINEURAL

## 2020-01-12 MED ORDER — METHOCARBAMOL 500 MG PO TABS
500.0000 mg | ORAL_TABLET | Freq: Four times a day (QID) | ORAL | Status: DC | PRN
  Administered 2020-01-13: 500 mg via ORAL
  Filled 2020-01-12: qty 1

## 2020-01-12 MED ORDER — ONDANSETRON HCL 4 MG/2ML IJ SOLN
INTRAMUSCULAR | Status: DC | PRN
  Administered 2020-01-12: 4 mg via INTRAVENOUS

## 2020-01-12 MED ORDER — TRANEXAMIC ACID-NACL 1000-0.7 MG/100ML-% IV SOLN
1000.0000 mg | Freq: Once | INTRAVENOUS | Status: AC
  Administered 2020-01-12: 1000 mg via INTRAVENOUS
  Filled 2020-01-12: qty 100

## 2020-01-12 MED ORDER — EPHEDRINE SULFATE-NACL 50-0.9 MG/10ML-% IV SOSY
PREFILLED_SYRINGE | INTRAVENOUS | Status: DC | PRN
  Administered 2020-01-12 (×4): 5 mg via INTRAVENOUS

## 2020-01-12 MED ORDER — HYDROMORPHONE HCL 1 MG/ML IJ SOLN
0.5000 mg | INTRAMUSCULAR | Status: DC | PRN
  Administered 2020-01-12: 1 mg via INTRAVENOUS
  Filled 2020-01-12: qty 1

## 2020-01-12 MED ORDER — MEPERIDINE HCL 50 MG/ML IJ SOLN: 6.2500 mg | INTRAMUSCULAR | Status: DC | PRN

## 2020-01-12 MED ORDER — OXYCODONE-ACETAMINOPHEN 5-325 MG PO TABS: 1.0000 | ORAL_TABLET | ORAL | 0 refills | Status: AC | PRN

## 2020-01-12 MED ORDER — BUPIVACAINE-EPINEPHRINE (PF) 0.5% -1:200000 IJ SOLN
INTRAMUSCULAR | Status: DC | PRN
Start: 2020-01-12 — End: 2020-01-12
  Administered 2020-01-12: 15 mL

## 2020-01-12 MED ORDER — THROMBIN (RECOMBINANT) 5000 UNITS EX SOLR
CUTANEOUS | Status: AC
  Filled 2020-01-12: qty 5000

## 2020-01-12 MED ORDER — CEFAZOLIN SODIUM-DEXTROSE 2-4 GM/100ML-% IV SOLN
2.0000 g | Freq: Four times a day (QID) | INTRAVENOUS | Status: AC
  Administered 2020-01-12 – 2020-01-13 (×3): 2 g via INTRAVENOUS
  Filled 2020-01-12 (×3): qty 100

## 2020-01-12 MED ORDER — EPHEDRINE 5 MG/ML INJ
INTRAVENOUS | Status: AC
  Filled 2020-01-12: qty 30

## 2020-01-12 MED ORDER — TRANEXAMIC ACID-NACL 1000-0.7 MG/100ML-% IV SOLN
INTRAVENOUS | Status: AC
  Filled 2020-01-12: qty 100

## 2020-01-12 MED ORDER — ONDANSETRON HCL 4 MG/2ML IJ SOLN: 4.0000 mg | Freq: Once | INTRAMUSCULAR | Status: DC | PRN

## 2020-01-12 MED ORDER — PHENYLEPHRINE HCL-NACL 10-0.9 MG/250ML-% IV SOLN
INTRAVENOUS | Status: AC
  Filled 2020-01-12: qty 250

## 2020-01-12 MED ORDER — METHOCARBAMOL 500 MG IVPB - SIMPLE MED
INTRAVENOUS | Status: AC
  Filled 2020-01-12: qty 50

## 2020-01-12 MED ORDER — CEFAZOLIN SODIUM-DEXTROSE 2-4 GM/100ML-% IV SOLN
2.0000 g | INTRAVENOUS | Status: AC
  Administered 2020-01-12: 2 g via INTRAVENOUS
  Filled 2020-01-12: qty 100

## 2020-01-12 MED ORDER — BUPIVACAINE LIPOSOME 1.3 % IJ SUSP
INTRAMUSCULAR | Status: DC | PRN
Start: 2020-01-12 — End: 2020-01-12
  Administered 2020-01-12: 10 mL via PERINEURAL

## 2020-01-12 MED ORDER — PROPOFOL 10 MG/ML IV BOLUS
INTRAVENOUS | Status: DC | PRN
  Administered 2020-01-12: 120 mg via INTRAVENOUS

## 2020-01-12 MED ORDER — LIDOCAINE 2% (20 MG/ML) 5 ML SYRINGE
INTRAMUSCULAR | Status: DC | PRN
  Administered 2020-01-12: 100 mg via INTRAVENOUS

## 2020-01-12 MED ORDER — ACETAMINOPHEN 325 MG PO TABS
650.0000 mg | ORAL_TABLET | Freq: Four times a day (QID) | ORAL | Status: DC | PRN
  Administered 2020-01-13: 650 mg via ORAL
  Filled 2020-01-12: qty 2

## 2020-01-12 SURGICAL SUPPLY — 66 items
BAG ZIPLOCK 12X15 (MISCELLANEOUS) IMPLANT
BIT DRILL 1.6MX128 (BIT) ×2 IMPLANT
BIT DRILL 1.6MX128MM (BIT) ×1
BIT DRILL QUICK REL 1/8 2PK SL (DRILL) ×1 IMPLANT
BLADE SAG 18X100X1.27 (BLADE) ×3 IMPLANT
CEMENT HV SMART SET (Cement) ×3 IMPLANT
CLOSURE WOUND 1/2 X4 (GAUZE/BANDAGES/DRESSINGS) ×1
COVER BACK TABLE 60X90IN (DRAPES) ×3 IMPLANT
COVER SURGICAL LIGHT HANDLE (MISCELLANEOUS) ×3 IMPLANT
COVER WAND RF STERILE (DRAPES) IMPLANT
DECANTER SPIKE VIAL GLASS SM (MISCELLANEOUS) ×3 IMPLANT
DRAPE INCISE IOBAN 66X45 STRL (DRAPES) ×3 IMPLANT
DRAPE ORTHO SPLIT 77X108 STRL (DRAPES) ×4
DRAPE SHEET LG 3/4 BI-LAMINATE (DRAPES) ×3 IMPLANT
DRAPE SURG ORHT 6 SPLT 77X108 (DRAPES) ×2 IMPLANT
DRAPE U-SHAPE 47X51 STRL (DRAPES) ×3 IMPLANT
DRILL QUICK RELEASE 1/8 INCH (DRILL) ×2
DRSG ADAPTIC 3X8 NADH LF (GAUZE/BANDAGES/DRESSINGS) ×3 IMPLANT
DRSG PAD ABDOMINAL 8X10 ST (GAUZE/BANDAGES/DRESSINGS) ×3 IMPLANT
DURAPREP 26ML APPLICATOR (WOUND CARE) ×3 IMPLANT
ELECT BLADE TIP CTD 4 INCH (ELECTRODE) ×3 IMPLANT
ELECT NEEDLE TIP 2.8 STRL (NEEDLE) ×3 IMPLANT
ELECT REM PT RETURN 15FT ADLT (MISCELLANEOUS) ×3 IMPLANT
GAUZE SPONGE 4X4 12PLY STRL (GAUZE/BANDAGES/DRESSINGS) ×3 IMPLANT
GLENOID MOD PE 4 PEG SZ 4 (Shoulder) ×3 IMPLANT
GLENOID MOD POST TM SZ2 (Post) ×3 IMPLANT
GLOVE BIOGEL PI ORTHO PRO 7.5 (GLOVE) ×2
GLOVE BIOGEL PI ORTHO PRO SZ8 (GLOVE) ×2
GLOVE ORTHO TXT STRL SZ7.5 (GLOVE) ×3 IMPLANT
GLOVE PI ORTHO PRO STRL 7.5 (GLOVE) ×1 IMPLANT
GLOVE PI ORTHO PRO STRL SZ8 (GLOVE) ×1 IMPLANT
GLOVE SURG ORTHO 8.5 STRL (GLOVE) ×3 IMPLANT
GOWN STRL REUS W/TWL XL LVL3 (GOWN DISPOSABLE) ×6 IMPLANT
HEAD HUMERAL COMP STD (Orthopedic Implant) ×1 IMPLANT
HEAD MODULAR W VARIABLE OFFSET (Orthopedic Implant) ×1 IMPLANT
HUMERAL HEAD COMP STD (Orthopedic Implant) ×3 IMPLANT
KIT BASIN (CUSTOM PROCEDURE TRAY) ×3 IMPLANT
KIT TURNOVER KIT A (KITS) IMPLANT
MANIFOLD NEPTUNE II (INSTRUMENTS) ×3 IMPLANT
MODULAR HEAD W VARIABLE OFFSET (Orthopedic Implant) ×3 IMPLANT
NEEDLE MAYO CATGUT SZ4 (NEEDLE) ×3 IMPLANT
PACK SHOULDER (CUSTOM PROCEDURE TRAY) ×3 IMPLANT
PASSER SUT SWANSON 36MM LOOP (INSTRUMENTS) ×3 IMPLANT
PENCIL SMOKE EVACUATOR (MISCELLANEOUS) IMPLANT
PIN THREADED REVERSE (PIN) ×3 IMPLANT
PROTECTOR NERVE ULNAR (MISCELLANEOUS) ×3 IMPLANT
REAMER GUIDE AUG PEG 4 LT (INSTRUMENTS) ×3 IMPLANT
RESTRAINT HEAD UNIVERSAL NS (MISCELLANEOUS) ×3 IMPLANT
SLING ARM FOAM STRAP LRG (SOFTGOODS) ×3 IMPLANT
SMARTMIX MINI TOWER (MISCELLANEOUS) ×3
SPONGE SURGIFOAM ABS GEL 12-7 (HEMOSTASIS) ×3 IMPLANT
STEM HUMERAL STRL 12MMX83MM (Stem) ×3 IMPLANT
STRIP CLOSURE SKIN 1/2X4 (GAUZE/BANDAGES/DRESSINGS) ×2 IMPLANT
SUCTION FRAZIER HANDLE 12FR (TUBING) ×2
SUCTION TUBE FRAZIER 12FR DISP (TUBING) ×1 IMPLANT
SUT FIBERWIRE #2 38 T-5 BLUE (SUTURE) ×12
SUT MAXBRAID #2 CVD NDL (SUTURE) ×6 IMPLANT
SUT MNCRL AB 4-0 PS2 18 (SUTURE) ×3 IMPLANT
SUT VIC AB 0 CT1 36 (SUTURE) ×3 IMPLANT
SUT VIC AB 0 CT2 27 (SUTURE) ×3 IMPLANT
SUT VIC AB 2-0 CT1 27 (SUTURE) ×2
SUT VIC AB 2-0 CT1 TAPERPNT 27 (SUTURE) ×1 IMPLANT
SUTURE FIBERWR #2 38 T-5 BLUE (SUTURE) ×4 IMPLANT
TOWEL OR 17X26 10 PK STRL BLUE (TOWEL DISPOSABLE) ×3 IMPLANT
TOWER SMARTMIX MINI (MISCELLANEOUS) ×1 IMPLANT
YANKAUER SUCT BULB TIP 10FT TU (MISCELLANEOUS) ×3 IMPLANT

## 2020-01-12 NOTE — Anesthesia Preprocedure Evaluation (Signed)
Anesthesia Evaluation  Patient identified by MRN, date of birth, ID band Patient awake    Reviewed: Allergy & Precautions, NPO status , Patient's Chart, lab work & pertinent test results  Airway Mallampati: I  TM Distance: >3 FB Neck ROM: Full    Dental   Pulmonary    Pulmonary exam normal        Cardiovascular Normal cardiovascular exam     Neuro/Psych    GI/Hepatic   Endo/Other    Renal/GU      Musculoskeletal   Abdominal   Peds  Hematology   Anesthesia Other Findings   Reproductive/Obstetrics                             Anesthesia Physical Anesthesia Plan  ASA: II  Anesthesia Plan: General   Post-op Pain Management:  Regional for Post-op pain   Induction: Intravenous  PONV Risk Score and Plan: 2 and Ondansetron and Midazolam  Airway Management Planned: Oral ETT  Additional Equipment:   Intra-op Plan:   Post-operative Plan: Extubation in OR  Informed Consent: I have reviewed the patients History and Physical, chart, labs and discussed the procedure including the risks, benefits and alternatives for the proposed anesthesia with the patient or authorized representative who has indicated his/her understanding and acceptance.       Plan Discussed with: CRNA and Surgeon  Anesthesia Plan Comments:         Anesthesia Quick Evaluation

## 2020-01-12 NOTE — Progress Notes (Signed)
Writer called On-Call for Dr Ranell Patrick, Spoke with Linna Caprice. Received order to start Tylenol tonight.  Informed of Nausea with Dilaudid and Allergy to Codeine.  Will hold off on giving Oxycodone and Dilaudid if pain managed.  Will give Reglan if nausea continues.

## 2020-01-12 NOTE — Transfer of Care (Signed)
Immediate Anesthesia Transfer of Care Note  Patient: Cory Collins  Procedure(s) Performed: TOTAL SHOULDER ARTHROPLASTY (Left Shoulder)  Patient Location: PACU  Anesthesia Type:General  Level of Consciousness: awake, alert  and oriented  Airway & Oxygen Therapy: Patient Spontanous Breathing and Patient connected to face mask oxygen  Post-op Assessment: Report given to RN and Post -op Vital signs reviewed and stable  Post vital signs: Reviewed and stable  Last Vitals:  Vitals Value Taken Time  BP 125/83 01/12/20 1822  Temp    Pulse 68 01/12/20 1824  Resp 15 01/12/20 1824  SpO2 100 % 01/12/20 1824  Vitals shown include unvalidated device data.  Last Pain:  Vitals:   01/12/20 1220  TempSrc:   PainSc: 5       Patients Stated Pain Goal: 4 (01/12/20 1220)  Complications: No complications documented.

## 2020-01-12 NOTE — Interval H&P Note (Signed)
History and Physical Interval Note:  01/12/2020 3:03 PM  Cory Collins  has presented today for surgery, with the diagnosis of left shoulder.  The various methods of treatment have been discussed with the patient and family. After consideration of risks, benefits and other options for treatment, the patient has consented to  Procedure(s): TOTAL SHOULDER ARTHROPLASTY (Left) as a surgical intervention.  The patient's history has been reviewed, patient examined, no change in status, stable for surgery.  I have reviewed the patient's chart and labs.  Questions were answered to the patient's satisfaction.     [name]

## 2020-01-12 NOTE — Plan of Care (Signed)
Plan of care discussed.   

## 2020-01-12 NOTE — Anesthesia Procedure Notes (Signed)
Anesthesia Regional Block: Interscalene brachial plexus block   Pre-Anesthetic Checklist: ,, timeout performed, Correct Patient, Correct Site, Correct Laterality, Correct Procedure, Correct Position, site marked, Risks and benefits discussed,  Surgical consent,  Pre-op evaluation,  At surgeon's request and post-op pain management  Laterality: Left  Prep: chloraprep       Needles:  Injection technique: Single-shot  Needle Type: Echogenic Stimulator Needle     Needle Length: 5cm  Needle Gauge: 21     Additional Needles:   Procedures:, nerve stimulator,,,,,,,   Nerve Stimulator or Paresthesia:  Response: 0.4 mA,   Additional Responses:   Narrative:  Start time: 01/12/2020 2:02 PM End time: 01/12/2020 2:12 PM Injection made incrementally with aspirations every 5 mL.  Performed by: Personally  Anesthesiologist: Arta Bruce, MD  Additional Notes: Monitors applied. Patient sedated. Sterile prep and drape,hand hygiene and sterile gloves were used. Relevant anatomy identified.Needle position confirmed.Local anesthetic injected incrementally after negative aspiration. Local anesthetic spread visualized around nerve(s). Vascular puncture avoided. No complications. Image printed for medical record.The patient tolerated the procedure well.

## 2020-01-12 NOTE — Discharge Instructions (Signed)
Ice to the shoulder constantly.  Keep the incision covered and clean and dry for one week, then ok to get it wet in the shower. ° °Do exercise as instructed several times per day. ° °DO NOT reach behind your back or push up out of a chair with the operative arm. ° °Use a sling while you are up and around for comfort, may remove while seated.  Keep pillow propped behind the operative elbow. ° °Follow up with Dr Hebert Dooling in two weeks in the office, call [phone] for appt °

## 2020-01-12 NOTE — Anesthesia Postprocedure Evaluation (Signed)
Anesthesia Post Note  Patient: Cory Collins  Procedure(s) Performed: TOTAL SHOULDER ARTHROPLASTY (Left Shoulder)     Patient location during evaluation: PACU Anesthesia Type: General Level of consciousness: awake and alert Pain management: pain level controlled Vital Signs Assessment: post-procedure vital signs reviewed and stable Respiratory status: spontaneous breathing, nonlabored ventilation, respiratory function stable and patient connected to nasal cannula oxygen Cardiovascular status: blood pressure returned to baseline and stable Postop Assessment: no apparent nausea or vomiting Anesthetic complications: no   No complications documented.  Last Vitals:  Vitals:   01/12/20 1830 01/12/20 1845  BP: 124/66 116/65  Pulse: 64 61  Resp: 14 15  Temp:    SpO2: 100% 100%    Last Pain:  Vitals:   01/12/20 1830  TempSrc:   PainSc: 0-No pain                 [name]

## 2020-01-12 NOTE — Progress Notes (Signed)
AssistedDr. Ossey with left, ultrasound guided, interscalene  block. Side rails up, monitors on throughout procedure. See vital signs in flow sheet. Tolerated Procedure well.  

## 2020-01-12 NOTE — Brief Op Note (Signed)
01/12/2020  5:57 PM  PATIENT:  Cory Collins  74 y.o. male  PRE-OPERATIVE DIAGNOSIS:  left shoulder end staged OA  POST-OPERATIVE DIAGNOSIS:  left shoulder end staged OA  PROCEDURE:  Procedure(s): TOTAL SHOULDER ARTHROPLASTY (Left)  [employer] Comprehensive anatomic shoulder replacement  SURGEON:  Surgeon(s) and Role:    Beverely Low, MD - Primary  PHYSICIAN ASSISTANT:   ASSISTANTS: Thea Gist, PA-C   ANESTHESIA:   regional and general  EBL:  200 mL   BLOOD ADMINISTERED:none  DRAINS: none   LOCAL MEDICATIONS USED:  MARCAINE     SPECIMEN:  No Specimen  DISPOSITION OF SPECIMEN:  N/A  COUNTS:  YES  TOURNIQUET:  * No tourniquets in log *  DICTATION: .Other Dictation: Dictation Number [id number]  PLAN OF CARE: Admit for overnight observation  PATIENT DISPOSITION:  PACU - hemodynamically stable.   Delay start of Pharmacological VTE agent (>24hrs) due to surgical blood loss or risk of bleeding: not applicable

## 2020-01-12 NOTE — Anesthesia Procedure Notes (Signed)
Procedure Name: Intubation Performed by: Marissa Weaver J, CRNA Pre-anesthesia Checklist: Patient identified, Emergency Drugs available, Suction available, Patient being monitored and Timeout performed Patient Re-evaluated:Patient Re-evaluated prior to induction Oxygen Delivery Method: Circle system utilized Preoxygenation: Pre-oxygenation with 100% oxygen Induction Type: IV induction Ventilation: Mask ventilation without difficulty Laryngoscope Size: Mac and 3 Grade View: Grade I Tube type: Oral Tube size: 7.5 mm Number of attempts: 1 Airway Equipment and Method: Stylet Placement Confirmation: ETT inserted through vocal cords under direct vision,  positive ETCO2 and breath sounds checked- equal and bilateral Secured at: 23 cm Tube secured with: Tape Dental Injury: Teeth and Oropharynx as per pre-operative assessment        

## 2020-01-13 DIAGNOSIS — M19012 Primary osteoarthritis, left shoulder: Secondary | ICD-10-CM | POA: Diagnosis not present

## 2020-01-13 LAB — BASIC METABOLIC PANEL
Anion gap: 8 (ref 5–15)
BUN: 12 mg/dL (ref 8–23)
CO2: 24 mmol/L (ref 22–32)
Calcium: 8.1 mg/dL — ABNORMAL LOW (ref 8.9–10.3)
Chloride: 103 mmol/L (ref 98–111)
Creatinine, Ser: 0.83 mg/dL (ref 0.61–1.24)
GFR calc Af Amer: >60 mL/min (ref >60)
GFR calc non Af Amer: >60 mL/min (ref >60)
Glucose, Bld: 152 mg/dL — ABNORMAL HIGH (ref 70–99)
Potassium: 4 mmol/L (ref 3.5–5.1)
Sodium: 135 mmol/L (ref 135–145)

## 2020-01-13 LAB — HEMOGLOBIN AND HEMATOCRIT, BLOOD
HCT: 35.6 % — ABNORMAL LOW (ref 39.0–52.0)
Hemoglobin: 12 g/dL — ABNORMAL LOW (ref 13.0–17.0)

## 2020-01-13 MED ORDER — TRAMADOL HCL 50 MG PO TABS: 50.0000 mg | ORAL_TABLET | Freq: Four times a day (QID) | ORAL | 0 refills | Status: AC | PRN

## 2020-01-13 MED ORDER — ONDANSETRON HCL 4 MG PO TABS: 4.0000 mg | ORAL_TABLET | Freq: Three times a day (TID) | ORAL | 1 refills | Status: AC | PRN

## 2020-01-13 NOTE — Op Note (Signed)
NAME: Cory Collins, Cory Collins MEDICAL RECORD IZ:[id number] ACCOUNT [id number] DATE OF BIRTH:September 15, 1945 FACILITY: [facility] LOCATION: [facility] PHYSICIAN:STEVEN Orlena Sheldon, MD  OPERATIVE REPORT  DATE OF PROCEDURE:  01/12/2020  PREOPERATIVE DIAGNOSIS:  Left shoulder end-stage osteoarthritis.  POSTOPERATIVE DIAGNOSIS:  Left shoulder end-stage osteoarthritis.  PROCEDURE PERFORMED:  Left anatomic total shoulder replacement using Biomet comprehensive system.  ATTENDING SURGEON:  Esmond Plants, MD  ASSISTANT:  Darol Destine, [place], who was scrubbed during the entire procedure and necessary for satisfactory completion of the surgery.  ANESTHESIA:  General anesthesia plus interscalene block was used.  ESTIMATED BLOOD LOSS:  250 mL.  FLUID REPLACEMENT:  1500 mL crystalloid.  INSTRUMENT COUNTS:  Correct.  COMPLICATIONS:  No complications.  ANTIBIOTICS:  Perioperative antibiotics were given.  INDICATIONS:  The patient is a 74 year old male with a history of worsening left shoulder pain secondary to end-stage osteoarthritis.  The patient had significant glenoid retroversion and erosion of all the cartilage.  There is cystic degeneration noted  subchondrally.  Having failed conservative management and having progressive pain and increased interference with ADLs, the patient presents now for operative treatment to restore his function and to eliminate pain with a total shoulder replacement.   Risks and benefits of surgical management discussed in detail with the patient.  Informed consent obtained.  DESCRIPTION OF PROCEDURE:  After an adequate level of anesthesia was achieved, the patient was positioned in modified beach chair position.  Left shoulder correctly identified and sterilely prepped and draped in the usual manner.  Time-out called,  verifying correct patient, correct site, we entered the patient's shoulder using standard deltopectoral approach started the coracoid process extending down to the  anterior humerus, dissection down through subcutaneous tissues using Bovie electrocautery.   We then identified the cephalic vein, took that laterally with the deltoid pectoralis taken medially.  Conjoined tendon identified and retracted medially.  Deep retractors placed.  Biceps was tenodesed in situ with 0 Vicryl figure-of-eight suture x2  incorporating the pectoralis tendon.  We then went ahead and released the subscapularis subperiosteally off the lesser tuberosity and tagged for repair at the end of the surgery with #2 FiberWire.  We released the inferior capsule progressively  externally rotating.  Large spurs were encountered.  We placed a T-handled Crego over the top of the humeral head underneath the biceps and the cuff and then a reverse Hohmann medially.  We then placed the elbow at the patient's side, 30 degrees of  external rotation and performed and anterior, posterior cut, the appropriate version.  Once we removed the head, we used a rongeur to remove large osteophytes around the periphery.  We then went ahead and subluxed the humerus posteriorly and gained good  exposure of the glenoid face, which was devoid of cartilage.  We released the biceps tendon and we then went ahead and did a capsular removal as well as labrum removal.  We found the center point for our glenoid preparation.  Initially thought we might  need an augment, but we went ahead and placed our guide pin.  We then reamed down the high side and we were able to get full contact with a size 4 glenoid for the comprehensive system.  We did our reaming for the 4 glenoid.  We checked with the trial to  make sure that we had full contact on the back side.  We did have good support with appropriate version.  We were T-ing off the 12 and 6 o'clock position on the scapula.  We  then drilled our central peg hole, which was going to be a tantalum peg and then  the 3 peripheral holes were drilled with the guide as well.  We then trialled  with the size 4 glenoid and then we went ahead and selected the real implant and cemented the three peripheral holes, impacting the implant into position.  Again it was well  supported and did not rock at all.  We held it until the cement was hardened.  We then went ahead and completed our preparation on the humeral side.  We broached up to a size 12 stem.  We placed a 44 x 18 and dialed to the D for eccentricity posterior  superiorly and that gave good bony coverage.  We reduced the shoulder and were happy with soft tissue balancing.  We removed all trial components, drilled holes in the lesser tuberosity and placed a #2 FiberWire suture for repair of the subscap.  We then  impacted the [place] stem into place.  This was the mini stem on the comprehensive system, size 12 and then the 44 x 18 head with dialed to the D setting and dialed the eccentricity posterior superiorly.  We impacted that on the trunnion, there onto  the taper and then reduced the shoulder.   Again, we had good stability.  I was able to deflect the head 50% posteriorly on the glenoid and it would go right back into position when I took off the posteriorly directed force, and I could pull it down  about 50%.  We went ahead and repaired the subscapularis anatomically after we irrigated thoroughly and then a deltopectoral closure with 0 Vicryl suture followed by 2-0 Vicryl for subcutaneous closure and 4-0 Monocryl for skin.  Steri-Strips applied  followed by sterile dressing.  The patient tolerated surgery well.  PN/NUANCE  D:01/12/2020 T:01/13/2020 JOB:[id number]

## 2020-01-13 NOTE — Plan of Care (Signed)
Pt ready to DC home with wife. 

## 2020-01-13 NOTE — Plan of Care (Signed)
  Problem: Education: Goal: Knowledge of General Education information will improve Description: Including pain rating scale, medication(s)/side effects and non-pharmacologic comfort measures Outcome: Progressing   Problem: Nutrition: Goal: Adequate nutrition will be maintained Outcome: Progressing   Problem: Activity: Goal: Risk for activity intolerance will decrease Outcome: Progressing   Problem: Coping: Goal: Level of anxiety will decrease Outcome: Progressing   Problem: Pain Managment: Goal: General experience of comfort will improve Outcome: Progressing   

## 2020-01-13 NOTE — Progress Notes (Signed)
Orthopedics Progress Note  Subjective: Left shoulder with some pain this morning. Nausea last night with pain meds  Objective:  Vitals:   01/13/20 0057 01/13/20 0454  BP: 132/82 111/68  Pulse: 80 61  Resp:  18  Temp: 97.6 F (36.4 C) 97.6 F (36.4 C)  SpO2: 98% 98%    General: Awake and alert  Musculoskeletal: left shoulder bandage changed, no signs for infection. No drainage Neurovascularly intact  Lab Results  Component Value Date   WBC 5.5 01/04/2020   HGB 12.0 (L) 01/13/2020   HCT 35.6 (L) 01/13/2020   MCV 99.1 01/04/2020   PLT 199 01/04/2020       Component Value Date/Time   NA 135 01/13/2020 0302   K 4.0 01/13/2020 0302   CL 103 01/13/2020 0302   CO2 24 01/13/2020 0302   GLUCOSE 152 (H) 01/13/2020 0302   BUN 12 01/13/2020 0302   CREATININE 0.83 01/13/2020 0302   CALCIUM 8.1 (L) 01/13/2020 0302   GFRNONAA >60 01/13/2020 0302   GFRAA >60 01/13/2020 0302    No results found for: INR, PROTIME  Assessment/Plan: POD #1 s/p Procedure(s): TOTAL SHOULDER ARTHROPLASTY Discharge to home. Will add Zofran and tramadol to discharge medications  Almedia Balls. Ranell Patrick, MD 01/13/2020 9:22 AM

## 2020-01-13 NOTE — Discharge Summary (Signed)
  Orthopedic Discharge Summary        Physician Discharge Summary  Patient ID: BORA BRONER MRN: [id number] DOB/AGE: [birth year] 74 y.o.  Admit date: 01/12/2020 Discharge date: 01/13/2020   Procedures:  Procedure(s) (LRB): TOTAL SHOULDER ARTHROPLASTY (Left)  Attending Physician:  Dr. Malon Kindle  Admission Diagnoses:   Left shoulder end stage OA  Discharge Diagnoses:  same   Past Medical History:  Diagnosis Date  . Arthritis     PCP: Ventura Sellers, MD   Discharged Condition: good  Hospital Course:  Patient underwent the above stated procedure on 01/12/2020. Patient tolerated the procedure well and brought to the recovery room in good condition and subsequently to the floor. Patient had an uncomplicated hospital course and was stable for discharge.   Disposition:  with follow up in 2 weeks    Follow-up Information    Beverely Low, MD. Call in 2 weeks.   Specialty: Orthopedic Surgery Why: [phone] Contact information: [address] [place] [address] [phone]                 Allergies as of 01/13/2020      Reactions   Codeine Nausea And Vomiting      Medication List    TAKE these medications   methocarbamol 500 MG tablet Commonly known as: Robaxin Take 1 tablet (500 mg total) by mouth every 6 (six) hours as needed.   ondansetron 4 MG tablet Commonly known as: Zofran Take 1 tablet (4 mg total) by mouth every 8 (eight) hours as needed for nausea or vomiting.   oxyCODONE-acetaminophen 5-325 MG tablet Commonly known as: Percocet Take 1 tablet by mouth every 4 (four) hours as needed for severe pain.   traMADol 50 MG tablet Commonly known as: Ultram Take 1 tablet (50 mg total) by mouth every 6 (six) hours as needed for moderate pain. May be taken for moderate pain. Do not take with other pain medication         Signed: [name] 01/13/2020, 9:29 AM  [facility] is now [employer] [address], [address], [place], [place] [address] Phone: [phone] Facebook  Instagram  [employer]

## 2020-01-13 NOTE — Evaluation (Signed)
Occupational Therapy Evaluation Patient Details Name: Cory Collins MRN: [id number] DOB: 11/10/1945 Today's Date: 01/13/2020    History of Present Illness Patient is a 74 year old male TOTAL SHOULDER ARTHROPLASTY (Left)    Clinical Impression   Patient/spouse educated in shoulder protocol, prescribed exercises, and compensatory strategies for self care. Patient/spouse demo understanding.    Follow Up Recommendations  Follow surgeon's recommendation for DC plan and follow-up therapies    Equipment Recommendations  None recommended by OT       Precautions / Restrictions Precautions Precautions: Shoulder Type of Shoulder Precautions: AROM elbow, wrist, hand ok Shoulder Interventions: Shoulder sling/immobilizer;Off for dressing/bathing/exercises Precaution Booklet Issued: Yes (comment) Required Braces or Orthoses: Sling Restrictions Weight Bearing Restrictions: Yes LUE Weight Bearing: Non weight bearing      Mobility Bed Mobility Overal bed mobility: Modified Independent                Transfers Overall transfer level: Independent                    Balance Overall balance assessment: No apparent balance deficits (not formally assessed)                                         ADL either performed or assessed with clinical judgement   ADL Overall ADL's : Modified independent                                       General ADL Comments: patient demo techniques for dressing, spouse also available at home to assist. min cues for maintaining shoulder precautions                  Pertinent Vitals/Pain Pain Assessment: 0-10 Pain Score: 3  Pain Location: L shoulder Pain Descriptors / Indicators: Discomfort Pain Intervention(s): Monitored during session;Repositioned     Hand Dominance Right   Extremity/Trunk Assessment Upper Extremity Assessment Upper Extremity Assessment: LUE deficits/detail LUE Deficits / Details:  AROM elbow, wrist, hand grossly intact   Lower Extremity Assessment Lower Extremity Assessment: Overall WFL for tasks assessed   Cervical / Trunk Assessment Cervical / Trunk Assessment: Normal   Communication Communication Communication: No difficulties   Cognition Arousal/Alertness: Awake/alert Behavior During Therapy: WFL for tasks assessed/performed Overall Cognitive Status: Within Functional Limits for tasks assessed                                        Exercises Exercises: Shoulder Shoulder Exercises Elbow Flexion: AROM;Left;5 reps;Seated Elbow Extension: AROM;Left;5 reps;Seated Wrist Flexion: AROM;Left;5 reps;Seated Wrist Extension: AROM;Left;5 reps;Seated Digit Composite Flexion: AROM;Left;5 reps;Seated Composite Extension: AROM;Left;5 reps;Seated   Shoulder Instructions Shoulder Instructions Donning/doffing shirt without moving shoulder: Independent;Patient able to independently direct caregiver Method for sponge bathing under operated UE: Supervision/safety;Patient able to independently direct caregiver Donning/doffing sling/immobilizer: Supervision/safety;Patient able to independently direct caregiver Correct positioning of sling/immobilizer: Independent;Patient able to independently direct caregiver Pendulum exercises (written home exercise program):  (N/A) ROM for elbow, wrist and digits of operated UE: Independent;Patient able to independently direct caregiver Sling wearing schedule (on at all times/off for ADL's): Independent;Patient able to independently direct caregiver Proper positioning of operated UE when showering: Supervision/safety;Patient able to independently direct caregiver Dressing  change: Patient able to independently direct caregiver Positioning of UE while sleeping: Patient able to independently direct caregiver    Home Living Family/patient expects to be discharged to:: Private residence Living Arrangements: Spouse/significant  other Available Help at Discharge: Family Type of Home: House       Home Layout: Two level;Bed/bath upstairs     Bathroom Shower/Tub: Teacher, early years/pre: Standard     Home Equipment: None          Prior Functioning/Environment Level of Independence: Independent                 OT Problem List: Pain;Impaired UE functional use         OT Goals(Current goals can be found in the care plan section) Acute Rehab OT Goals Patient Stated Goal: go home OT Goal Formulation: With patient Time For Goal Achievement: 01/27/20 Potential to Achieve Goals: Good   AM-PAC OT "6 Clicks" Daily Activity     Outcome Measure Help from another person eating meals?: None Help from another person taking care of personal grooming?: None Help from another person toileting, which includes using toliet, bedpan, or urinal?: None Help from another person bathing (including washing, rinsing, drying)?: None Help from another person to put on and taking off regular upper body clothing?: None Help from another person to put on and taking off regular lower body clothing?: None 6 Click Score: 24   End of Session Nurse Communication: Mobility status  Activity Tolerance: Patient tolerated treatment well Patient left: in bed;with call bell/phone within reach;with family/visitor present  OT Visit Diagnosis: Pain Pain - Right/Left: Left Pain - part of body: Shoulder                Time: 8299-3716 OT Time Calculation (min): 24 min Charges:  OT General Charges $OT Visit: 1 Visit OT Evaluation $OT Eval Moderate Complexity: 1 Mod  Delbert Phenix OT Pager: [phone]  Rosemary Holms 01/13/2020, 10:43 AM

## 2020-01-15 ENCOUNTER — Encounter (HOSPITAL_COMMUNITY): Payer: Self-pay | Admitting: Orthopedic Surgery

## 2021-05-12 IMAGING — CT CT SHOULDER*L* W/O CM
2 series · 10 of 14 positions shown, 12 images · non-contrast
Comparison: None.

CLINICAL DATA: Preop shoulder replacement. Limited range of motion.

EXAM:
CT OF THE UPPER LEFT EXTREMITY WITHOUT CONTRAST
TECHNIQUE: Multidetector CT imaging of the upper left extremity was performed
according to the standard protocol.

[Series 2: bone · axial · 0.52mm/px · z∈[-471,-325]mm · 5 of 111 slices shown, 7 images]
[im 19/111  soft-tissue]
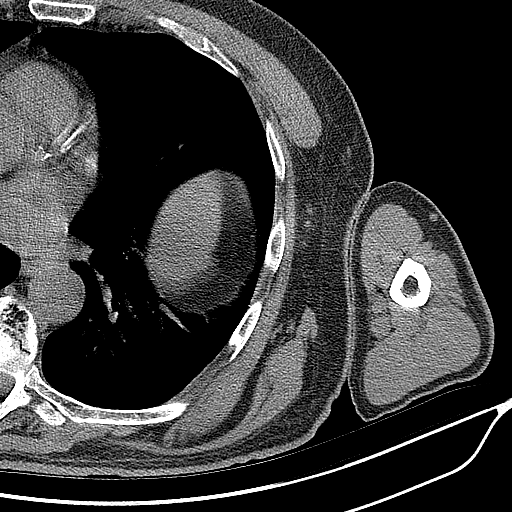
[im 19/111  bone]
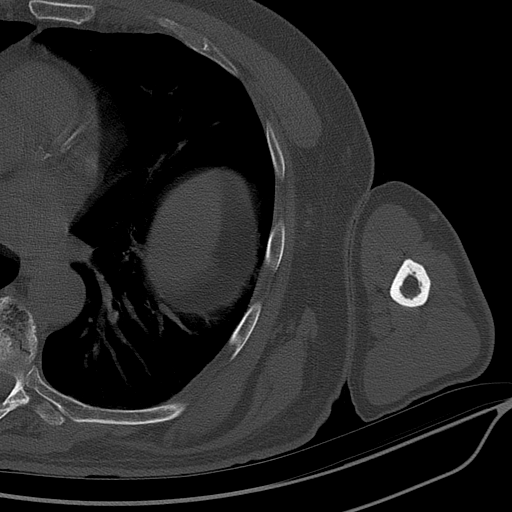
[im 37/111  bone]
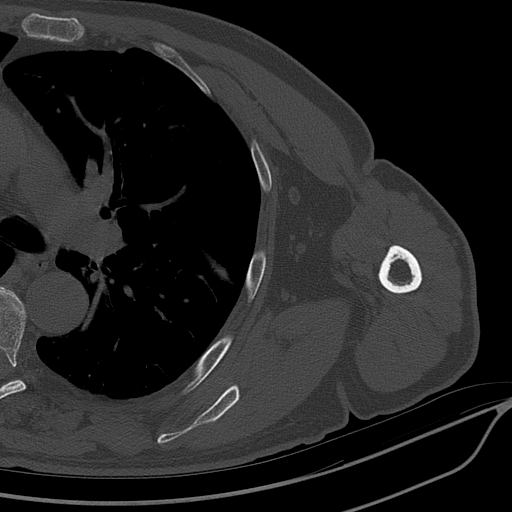
[im 56/111  bone]
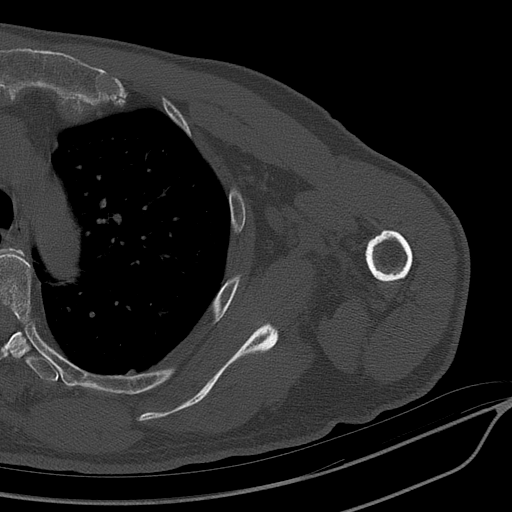
[im 74/111  bone]
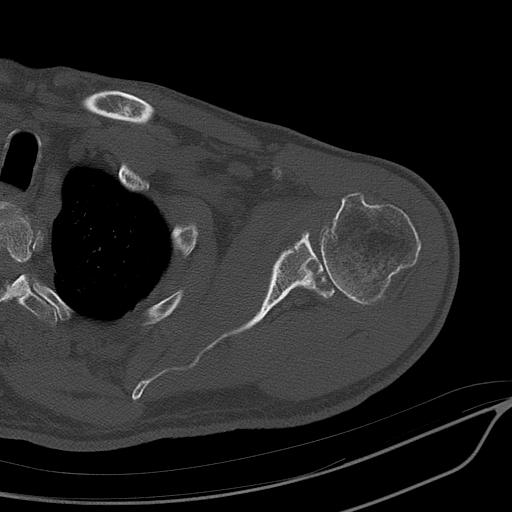
[im 92/111  soft-tissue]
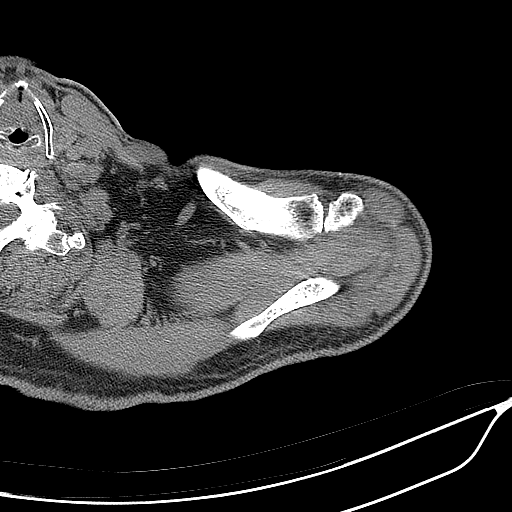
[im 92/111  bone]
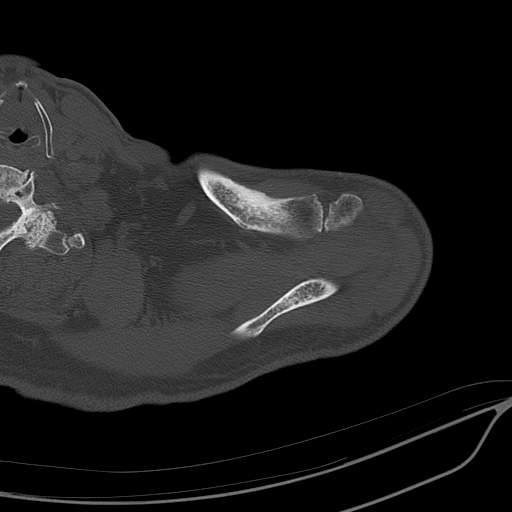

[Series 3: soft tissue · axial · 0.52mm/px · z∈[-467,-323]mm · 5 of 109 slices shown]
[im 19/109  soft-tissue]
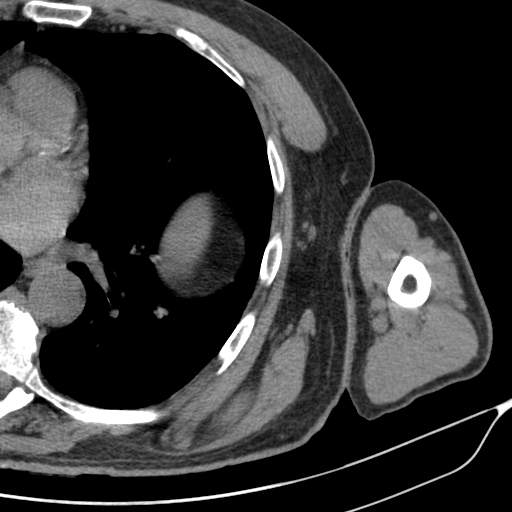
[im 37/109  soft-tissue]
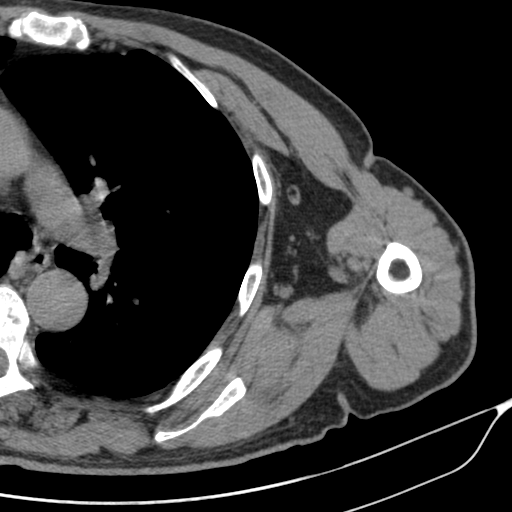
[im 55/109  soft-tissue]
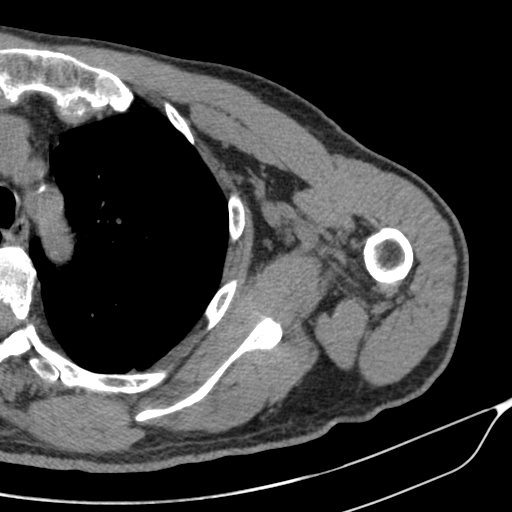
[im 73/109  soft-tissue]
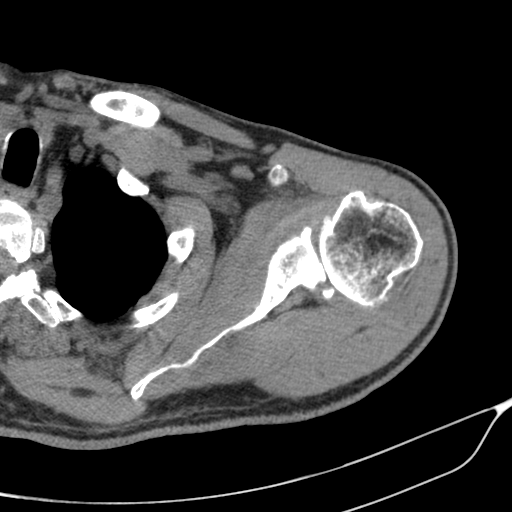
[im 91/109  soft-tissue]
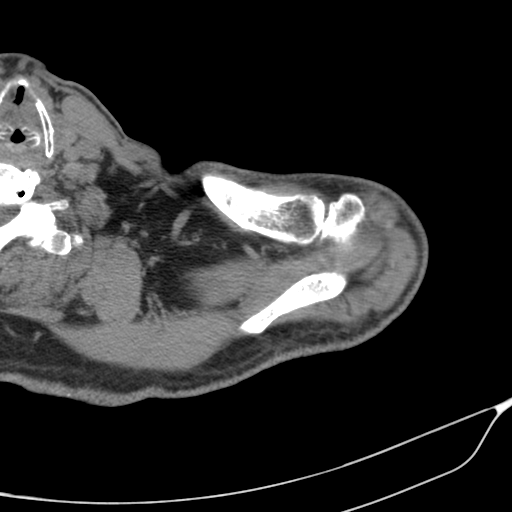

[10 of 14 positions shown; findings below may reference images not displayed]

FINDINGS: Bones/Joint/Cartilage

No fracture or dislocation. Normal alignment. No joint effusion.

Severe osteoarthritis of the glenohumeral joint with severe joint
space narrowing, bone-on-bone appearance, subchondral sclerosis,
subchondral cystic changes and marginal osteophytosis. Large
conglomeration of subchondral cyst in the superior posterior glenoid
measuring 1.3 x 0.7 x 1.4 cm cumulatively and encompassing a
significant portion of the glenoid.

Mild arthropathy of the acromioclavicular joint.  Type II acromion.

Partially visualized lower cervical and thoracic spine spondylosis.
Right foraminal stenosis at C4-5, C5-6 and C6-7.

Ligaments

Ligaments are suboptimally evaluated by CT.

Muscles and Tendons
Muscles are normal.  No muscle atrophy.

Soft tissue
No fluid collection or hematoma. No soft tissue mass. Visualized
right lung is clear. Mild scarring in the superior segment of the
left lower lobe.
IMPRESSION: 1. Severe osteoarthritis of the glenohumeral joint. Large
conglomeration of subchondral cyst in the superior posterior glenoid
measuring 1.3 x 0.7 x 1.4 cm cumulatively and encompassing a
significant portion of the glenoid.
2. Partially visualized cervical spine spondylosis.

## 2021-06-25 IMAGING — DX DG SHOULDER 1V*L*
3 series · 3 of 3 positions shown · non-contrast
Comparison: Radiograph 05/08/2019, CT 11/29/2019

CLINICAL DATA: Postop total left shoulder arthroplasty.

EXAM:
LEFT SHOULDER

[shoulder ap]
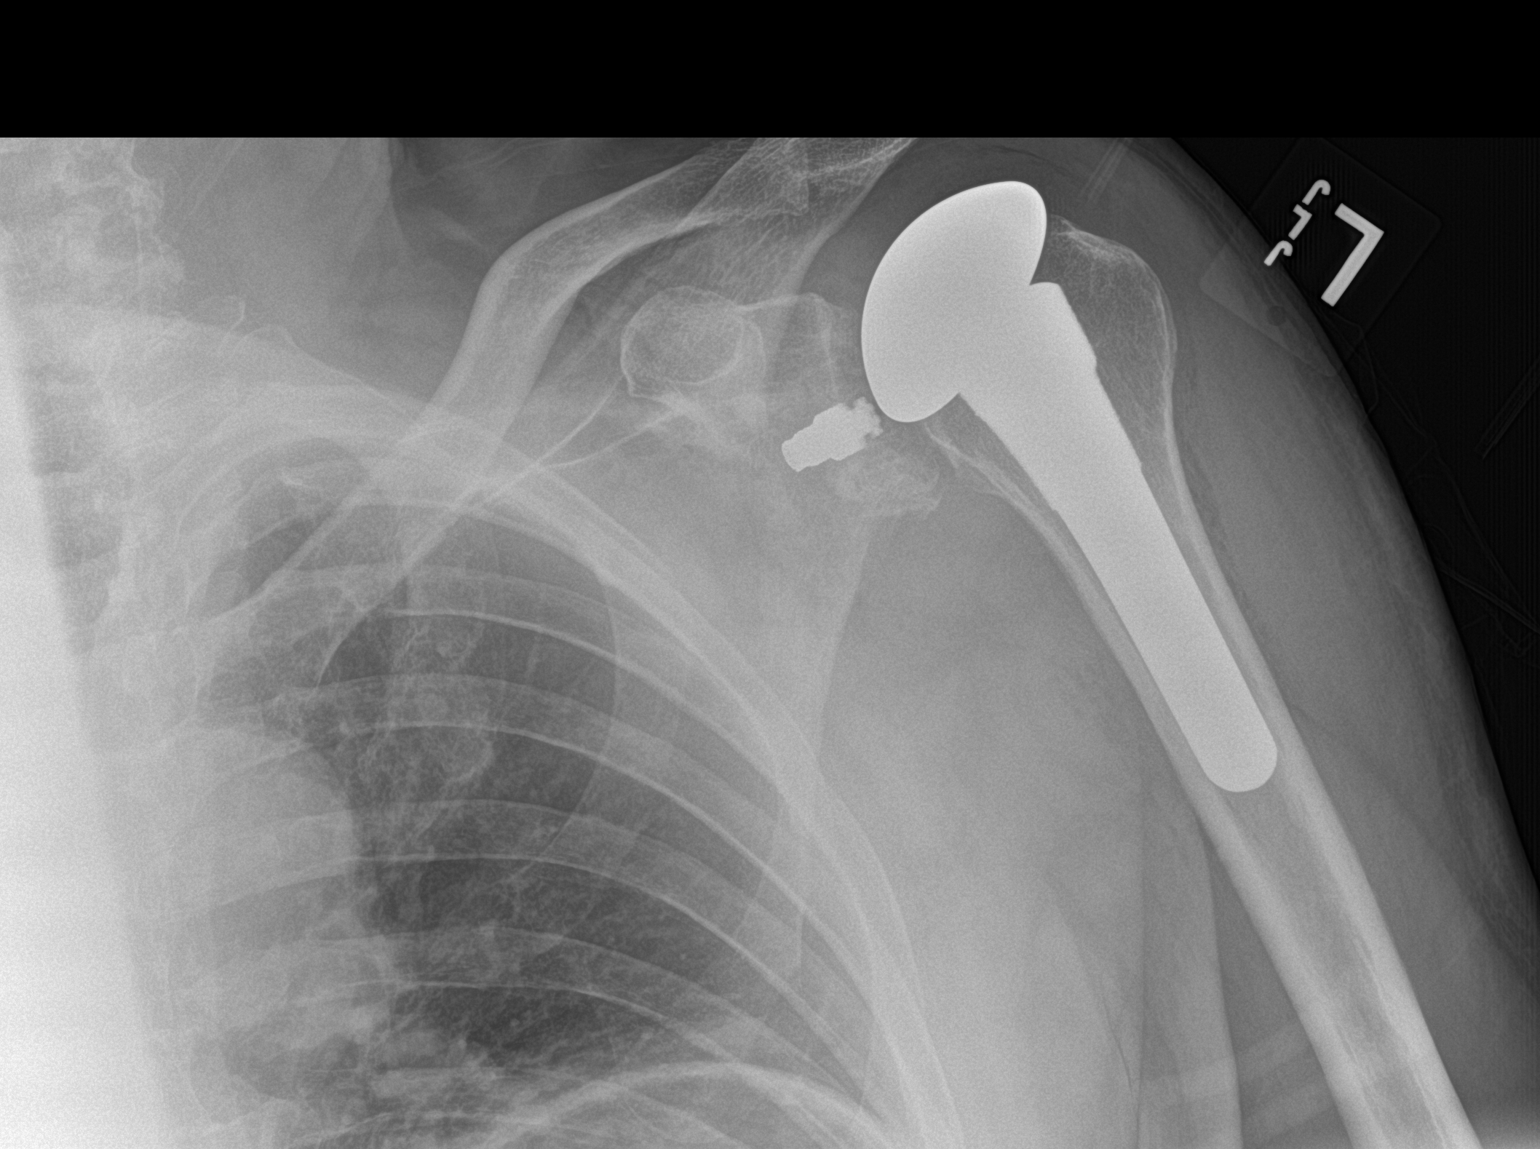

[shoulder obl (1 of 2)]
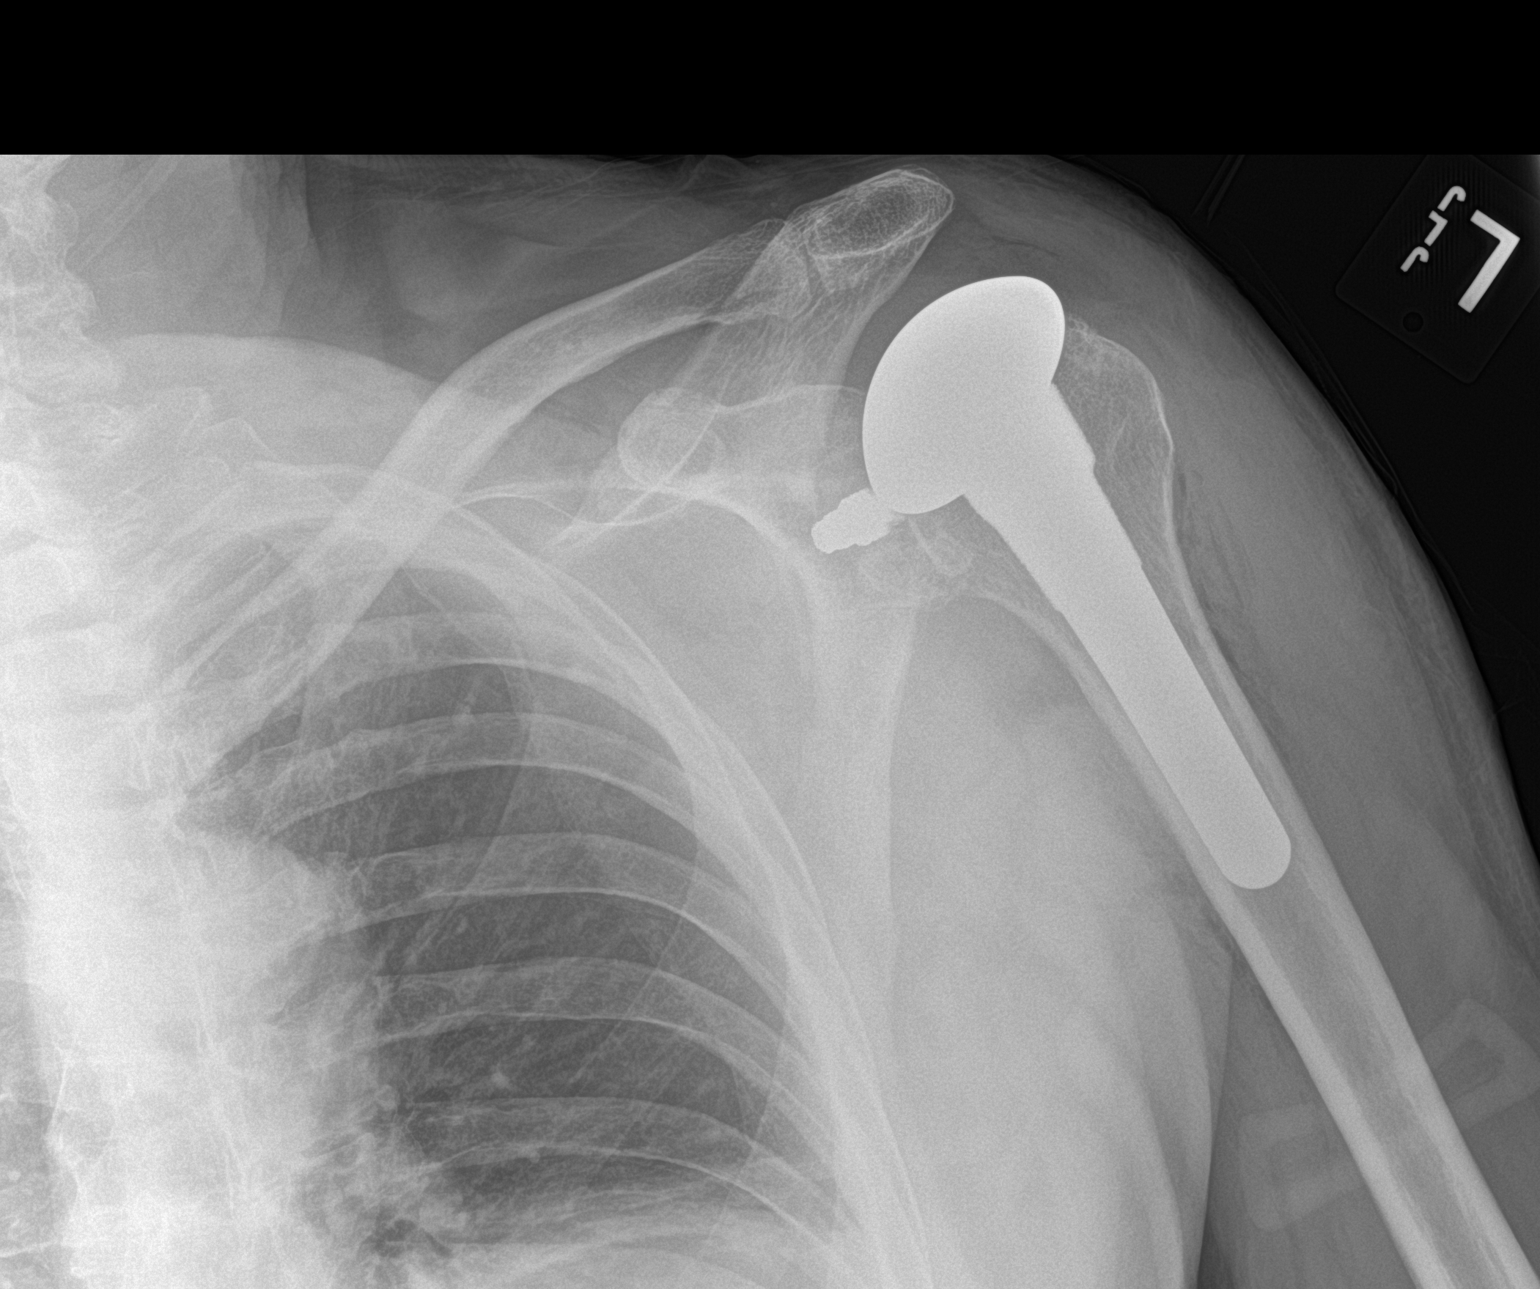

[shoulder obl (2 of 2)]
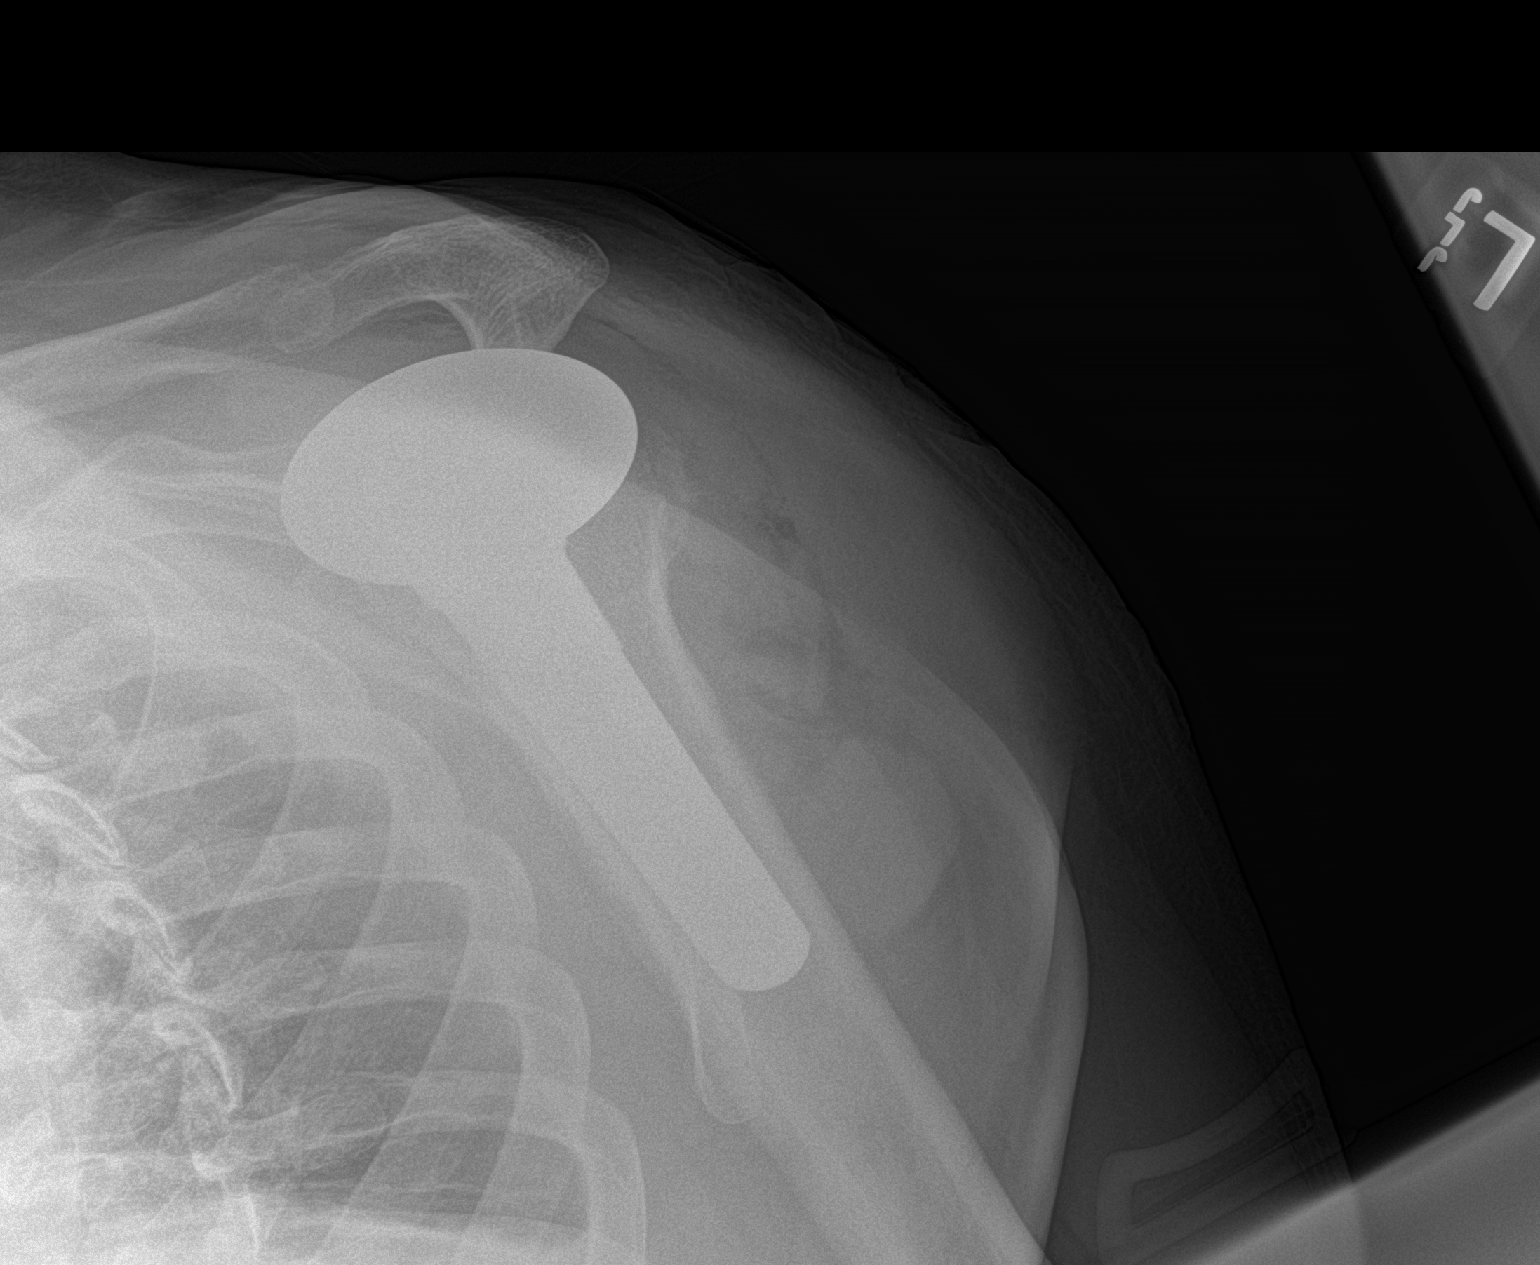

[3 of 3 positions shown; findings below may reference images not displayed]

FINDINGS: There are postsurgical changes from total left shoulder arthroplasty
including an articulating humeral component with intramedullary stem
as well as visualization of a metallic anchor of a polyethylene
glenoid cup. Slightly high-riding appearance of the humeral head is
nonspecific but could reflect some underlying rotator cuff
insufficiency. Otherwise alignment is unremarkable with only mild
edematous postsurgical soft tissue changes and few foci of soft
tissue gas. Mild degenerative changes of the acromioclavicular
joint. Included portion of the left chest wall and lung are
unremarkable.
IMPRESSION: 1. Postsurgical changes from total left shoulder arthroplasty
without evidence of acute hardware complication.
2. Slightly high-riding appearance of the humeral head is
nonspecific but could reflect some underlying rotator cuff
insufficiency.
